# Patient Record
Sex: Female | Born: 1965 | Race: White | Hispanic: No | Marital: Married | Smoking: Never smoker
Health system: Southern US, Community
[De-identification: ages and names within clinical notes are randomized; demographics above are authoritative.]

## PROBLEM LIST (undated history)

## (undated) DIAGNOSIS — R87610 Atypical squamous cells of undetermined significance on cytologic smear of cervix (ASC-US): Secondary | ICD-10-CM

## (undated) DIAGNOSIS — M4802 Spinal stenosis, cervical region: Secondary | ICD-10-CM

## (undated) DIAGNOSIS — R3915 Urgency of urination: Secondary | ICD-10-CM

## (undated) DIAGNOSIS — G43909 Migraine, unspecified, not intractable, without status migrainosus: Secondary | ICD-10-CM

## (undated) DIAGNOSIS — Z923 Personal history of irradiation: Secondary | ICD-10-CM

## (undated) DIAGNOSIS — E039 Hypothyroidism, unspecified: Secondary | ICD-10-CM

## (undated) DIAGNOSIS — C50919 Malignant neoplasm of unspecified site of unspecified female breast: Secondary | ICD-10-CM

## (undated) HISTORY — DX: Atypical squamous cells of undetermined significance on cytologic smear of cervix (ASC-US): R87.610

## (undated) HISTORY — DX: Malignant neoplasm of unspecified site of unspecified female breast: C50.919

## (undated) HISTORY — PX: AUGMENTATION MAMMAPLASTY: SUR837

---

## 1994-11-03 HISTORY — PX: DIAGNOSTIC LAPAROSCOPY: SUR761

## 1999-02-01 ENCOUNTER — Other Ambulatory Visit: Admission: RE | Admit: 1999-02-01 | Discharge: 1999-02-01 | Payer: Self-pay | Admitting: Gynecology

## 2000-03-24 ENCOUNTER — Other Ambulatory Visit: Admission: RE | Admit: 2000-03-24 | Discharge: 2000-03-24 | Payer: Self-pay | Admitting: Gynecology

## 2001-04-12 ENCOUNTER — Other Ambulatory Visit: Admission: RE | Admit: 2001-04-12 | Discharge: 2001-04-12 | Payer: Self-pay | Admitting: Family Medicine

## 2001-04-25 ENCOUNTER — Encounter: Payer: Self-pay | Admitting: Family Medicine

## 2001-04-25 ENCOUNTER — Ambulatory Visit (HOSPITAL_COMMUNITY): Admission: RE | Admit: 2001-04-25 | Discharge: 2001-04-25 | Payer: Self-pay | Admitting: Family Medicine

## 2001-07-16 ENCOUNTER — Other Ambulatory Visit: Admission: RE | Admit: 2001-07-16 | Discharge: 2001-07-16 | Payer: Self-pay | Admitting: Family Medicine

## 2002-03-09 ENCOUNTER — Other Ambulatory Visit: Admission: RE | Admit: 2002-03-09 | Discharge: 2002-03-09 | Payer: Self-pay | Admitting: Gynecology

## 2003-05-24 ENCOUNTER — Ambulatory Visit (HOSPITAL_COMMUNITY): Admission: RE | Admit: 2003-05-24 | Discharge: 2003-05-24 | Payer: Self-pay | Admitting: Family Medicine

## 2003-05-24 ENCOUNTER — Encounter: Payer: Self-pay | Admitting: Family Medicine

## 2003-07-19 ENCOUNTER — Other Ambulatory Visit: Admission: RE | Admit: 2003-07-19 | Discharge: 2003-07-19 | Payer: Self-pay | Admitting: Family Medicine

## 2004-01-17 ENCOUNTER — Other Ambulatory Visit: Admission: RE | Admit: 2004-01-17 | Discharge: 2004-01-17 | Payer: Self-pay | Admitting: Gynecology

## 2005-04-22 ENCOUNTER — Other Ambulatory Visit: Admission: RE | Admit: 2005-04-22 | Discharge: 2005-04-22 | Payer: Self-pay | Admitting: Family Medicine

## 2006-05-20 ENCOUNTER — Other Ambulatory Visit: Admission: RE | Admit: 2006-05-20 | Discharge: 2006-05-20 | Payer: Self-pay | Admitting: Family Medicine

## 2007-05-26 ENCOUNTER — Other Ambulatory Visit: Admission: RE | Admit: 2007-05-26 | Discharge: 2007-05-26 | Payer: Self-pay | Admitting: Family Medicine

## 2007-06-01 ENCOUNTER — Ambulatory Visit (HOSPITAL_COMMUNITY): Admission: RE | Admit: 2007-06-01 | Discharge: 2007-06-01 | Payer: Self-pay | Admitting: Family Medicine

## 2007-12-16 ENCOUNTER — Encounter: Admission: RE | Admit: 2007-12-16 | Discharge: 2007-12-16 | Payer: Self-pay | Admitting: Family Medicine

## 2008-05-26 ENCOUNTER — Other Ambulatory Visit: Admission: RE | Admit: 2008-05-26 | Discharge: 2008-05-26 | Payer: Self-pay | Admitting: Family Medicine

## 2008-11-03 DIAGNOSIS — C50919 Malignant neoplasm of unspecified site of unspecified female breast: Secondary | ICD-10-CM

## 2008-11-03 HISTORY — PX: BREAST SURGERY: SHX581

## 2008-11-03 HISTORY — DX: Malignant neoplasm of unspecified site of unspecified female breast: C50.919

## 2009-06-18 ENCOUNTER — Other Ambulatory Visit: Admission: RE | Admit: 2009-06-18 | Discharge: 2009-06-18 | Payer: Self-pay | Admitting: Family Medicine

## 2009-07-18 ENCOUNTER — Ambulatory Visit (HOSPITAL_COMMUNITY): Admission: RE | Admit: 2009-07-18 | Discharge: 2009-07-18 | Payer: Self-pay | Admitting: Family Medicine

## 2009-07-27 ENCOUNTER — Encounter: Admission: RE | Admit: 2009-07-27 | Discharge: 2009-07-27 | Payer: Self-pay | Admitting: Family Medicine

## 2009-09-05 ENCOUNTER — Encounter (INDEPENDENT_AMBULATORY_CARE_PROVIDER_SITE_OTHER): Payer: Self-pay | Admitting: Family Medicine

## 2009-09-05 ENCOUNTER — Encounter: Admission: RE | Admit: 2009-09-05 | Discharge: 2009-09-05 | Payer: Self-pay | Admitting: Family Medicine

## 2009-09-05 ENCOUNTER — Encounter (INDEPENDENT_AMBULATORY_CARE_PROVIDER_SITE_OTHER): Payer: Self-pay | Admitting: Diagnostic Radiology

## 2009-09-11 ENCOUNTER — Encounter: Admission: RE | Admit: 2009-09-11 | Discharge: 2009-09-11 | Payer: Self-pay | Admitting: Family Medicine

## 2009-09-24 ENCOUNTER — Encounter: Admission: RE | Admit: 2009-09-24 | Discharge: 2009-09-24 | Payer: Self-pay | Admitting: General Surgery

## 2009-09-25 ENCOUNTER — Encounter: Admission: RE | Admit: 2009-09-25 | Discharge: 2009-09-25 | Payer: Self-pay | Admitting: General Surgery

## 2009-09-25 ENCOUNTER — Ambulatory Visit (HOSPITAL_BASED_OUTPATIENT_CLINIC_OR_DEPARTMENT_OTHER): Admission: RE | Admit: 2009-09-25 | Discharge: 2009-09-25 | Payer: Self-pay | Admitting: General Surgery

## 2009-09-25 HISTORY — PX: BREAST LUMPECTOMY: SHX2

## 2009-10-03 ENCOUNTER — Ambulatory Visit: Admission: RE | Admit: 2009-10-03 | Discharge: 2009-10-31 | Payer: Self-pay | Admitting: Radiation Oncology

## 2009-10-23 ENCOUNTER — Ambulatory Visit: Payer: Self-pay | Admitting: Oncology

## 2009-10-23 LAB — COMPREHENSIVE METABOLIC PANEL
ALT: 15 U/L (ref 0–35)
AST: 18 U/L (ref 0–37)
Alkaline Phosphatase: 31 U/L — ABNORMAL LOW (ref 39–117)
BUN: 12 mg/dL (ref 6–23)
Calcium: 8.5 mg/dL (ref 8.4–10.5)
Chloride: 105 mEq/L (ref 96–112)
Creatinine, Ser: 0.68 mg/dL (ref 0.40–1.20)

## 2009-10-23 LAB — CBC WITH DIFFERENTIAL/PLATELET
BASO%: 0.3 % (ref 0.0–2.0)
EOS%: 2.6 % (ref 0.0–7.0)
HCT: 36.6 % (ref 34.8–46.6)
MCH: 33.8 pg (ref 25.1–34.0)
MCHC: 34.2 g/dL (ref 31.5–36.0)
MCV: 98.8 fL (ref 79.5–101.0)
MONO%: 6.2 % (ref 0.0–14.0)
NEUT%: 72.3 % (ref 38.4–76.8)
RDW: 12.1 % (ref 11.2–14.5)
lymph#: 1.2 10*3/uL (ref 0.9–3.3)

## 2009-10-29 ENCOUNTER — Encounter: Admission: RE | Admit: 2009-10-29 | Discharge: 2009-10-29 | Payer: Self-pay | Admitting: Radiation Oncology

## 2009-11-05 ENCOUNTER — Ambulatory Visit: Admission: RE | Admit: 2009-11-05 | Discharge: 2010-01-04 | Payer: Self-pay | Admitting: Radiation Oncology

## 2009-12-27 ENCOUNTER — Encounter: Admission: RE | Admit: 2009-12-27 | Discharge: 2009-12-27 | Payer: Self-pay | Admitting: Family Medicine

## 2010-05-31 ENCOUNTER — Encounter: Admission: RE | Admit: 2010-05-31 | Discharge: 2010-05-31 | Payer: Self-pay | Admitting: Family Medicine

## 2010-11-24 ENCOUNTER — Encounter: Payer: Self-pay | Admitting: Family Medicine

## 2011-02-05 LAB — DIFFERENTIAL
Basophils Absolute: 0 10*3/uL (ref 0.0–0.1)
Eosinophils Absolute: 0.1 10*3/uL (ref 0.0–0.7)
Eosinophils Relative: 2 % (ref 0–5)
Monocytes Absolute: 0.4 10*3/uL (ref 0.1–1.0)

## 2011-02-05 LAB — CBC
MCHC: 34.9 g/dL (ref 30.0–36.0)
MCV: 98.7 fL (ref 78.0–100.0)
Platelets: 189 10*3/uL (ref 150–400)

## 2011-02-05 LAB — BASIC METABOLIC PANEL
BUN: 12 mg/dL (ref 6–23)
CO2: 28 mEq/L (ref 19–32)
Calcium: 8.8 mg/dL (ref 8.4–10.5)
Creatinine, Ser: 0.85 mg/dL (ref 0.4–1.2)
Glucose, Bld: 69 mg/dL — ABNORMAL LOW (ref 70–99)

## 2011-02-05 LAB — CEA: CEA: 0.8 ng/mL (ref 0.0–5.0)

## 2011-02-05 LAB — LACTATE DEHYDROGENASE: LDH: 122 U/L (ref 94–250)

## 2011-03-14 ENCOUNTER — Other Ambulatory Visit: Payer: Self-pay | Admitting: Dermatology

## 2011-05-20 ENCOUNTER — Other Ambulatory Visit (HOSPITAL_COMMUNITY): Payer: Self-pay | Admitting: Family Medicine

## 2011-05-20 DIAGNOSIS — Z1231 Encounter for screening mammogram for malignant neoplasm of breast: Secondary | ICD-10-CM

## 2011-07-02 ENCOUNTER — Ambulatory Visit (HOSPITAL_COMMUNITY)
Admission: RE | Admit: 2011-07-02 | Discharge: 2011-07-02 | Disposition: A | Discharge status: Home / Self Care | Payer: BC Managed Care – PPO | Source: Ambulatory Visit | Attending: Family Medicine | Admitting: Family Medicine

## 2011-07-02 DIAGNOSIS — Z1231 Encounter for screening mammogram for malignant neoplasm of breast: Secondary | ICD-10-CM

## 2011-07-08 ENCOUNTER — Other Ambulatory Visit: Payer: Self-pay | Admitting: Family Medicine

## 2011-07-08 DIAGNOSIS — Z9889 Other specified postprocedural states: Secondary | ICD-10-CM

## 2011-07-11 ENCOUNTER — Ambulatory Visit
Admission: RE | Admit: 2011-07-11 | Discharge: 2011-07-11 | Disposition: A | Discharge status: Home / Self Care | Payer: BC Managed Care – PPO | Source: Ambulatory Visit | Attending: Family Medicine | Admitting: Family Medicine

## 2011-07-11 DIAGNOSIS — Z9889 Other specified postprocedural states: Secondary | ICD-10-CM

## 2012-01-05 ENCOUNTER — Other Ambulatory Visit: Payer: Self-pay | Admitting: Family Medicine

## 2012-02-20 ENCOUNTER — Telehealth: Payer: Self-pay | Admitting: *Deleted

## 2012-02-20 NOTE — Telephone Encounter (Signed)
Patient was driving and not able to speak with me.  Requested for me to call her on Monday.

## 2012-02-23 ENCOUNTER — Telehealth: Payer: Self-pay | Admitting: *Deleted

## 2012-02-23 NOTE — Telephone Encounter (Signed)
Confirmed 03/04/12 appt w/ pt.

## 2012-03-03 ENCOUNTER — Telehealth: Payer: Self-pay | Admitting: *Deleted

## 2012-03-03 NOTE — Telephone Encounter (Signed)
Called patient to reschedule 5/2 genetics appt due to Clydie Braun unable to be here.  Confirmed 03/18/12 genetics appt w/ pt.

## 2012-03-04 ENCOUNTER — Encounter: Payer: BC Managed Care – PPO | Admitting: Genetic Counselor

## 2012-03-04 ENCOUNTER — Other Ambulatory Visit: Payer: BC Managed Care – PPO | Admitting: Lab

## 2012-03-08 ENCOUNTER — Other Ambulatory Visit: Payer: Self-pay | Admitting: Family Medicine

## 2012-03-08 DIAGNOSIS — N63 Unspecified lump in unspecified breast: Secondary | ICD-10-CM

## 2012-03-11 ENCOUNTER — Ambulatory Visit
Admission: RE | Admit: 2012-03-11 | Discharge: 2012-03-11 | Disposition: A | Discharge status: Home / Self Care | Payer: BC Managed Care – PPO | Source: Ambulatory Visit | Attending: Family Medicine | Admitting: Family Medicine

## 2012-03-11 DIAGNOSIS — N63 Unspecified lump in unspecified breast: Secondary | ICD-10-CM

## 2012-03-18 ENCOUNTER — Other Ambulatory Visit: Payer: BC Managed Care – PPO | Admitting: Lab

## 2012-03-18 ENCOUNTER — Ambulatory Visit (HOSPITAL_BASED_OUTPATIENT_CLINIC_OR_DEPARTMENT_OTHER): Payer: BC Managed Care – PPO | Admitting: Genetic Counselor

## 2012-03-18 DIAGNOSIS — C50919 Malignant neoplasm of unspecified site of unspecified female breast: Secondary | ICD-10-CM

## 2012-03-18 NOTE — Progress Notes (Signed)
Dr. Dwain Sarna requested a consultation for genetic counseling and risk assessment for [name], a 46 y.o. female, for discussion of her personal history of breast cancer and family history of a known BRCA mutation. She presents to clinic today to discuss the possibility of a genetic predisposition to cancer, and to further clarify her risks, as well as her family members' risks for cancer.   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 This was treated with surgery and radiation.   No past medical history on file.  No past surgical history on file.  History  Substance Use Topics  . Smoking status: Not on file  . Smokeless tobacco: Not on file  . Alcohol Use: Not on file    REPRODUCTIVE HISTORY AND PERSONAL RISK ASSESSMENT FACTORS: Menarche was at age 54.   Premenopausal Uterus Intact: Yes Ovaries Intact: Yes G2P2A0 , first live birth at age 73  She has previously undergone treatment for infertility.   OCP use for 2 years She has not used HRT in the past.    FAMILY HISTORY:  We obtained a detailed, 4-generation family history.  Significant diagnoses are listed below: The patient was diagnosed with breast cancer at age 46.  Her maternal uncle was diagnosed with appendiceal cancer around age 58.  He has two daughters - one was diagnosed with breast cancer at ages 34 and 52, the other was diagnosed under the age of 32.  Both of these individuals reportedly tested positive for a BRCA mutation.  The patient's maternal grandmother was diagnosed with endometrial cancer at age 30 and colon cancer at age 65.  There is no other reported cancer on this side of the family.  The patient has a paternal cousin who was diagnosed with non-hodgkins lymphoma at age 4, and breast cancer in her 40s.  There is no other reported cancer on this side of the family.  Patient's maternal ancestors are of English descent,  and paternal ancestors are of unknown descent. There is no reported Ashkenazi Jewish ancestry. There is no  known consanguinity.  GENETIC COUNSELING RISK ASSESSMENT, DISCUSSION, AND SUGGESTED FOLLOW UP: We reviewed the natural history and genetic etiology of sporadic, familial and hereditary cancer syndromes.  About 5-10% of breast cancer is hereditary and of this, about 85% is the result of BRCA1 and 2 mutations. We reviewed the red flags of hereditary breast cancer and dominant inheritance patterns. The patient's cousins tested positive for a BRCA mutation. She was unsure whether the cousin's mother had cancer.  Therefore, the patient's uncle has a 50% chance of being a carrier of the BRCA mutation, the patients' mother has a 25% chance of having a mutation - even though she has never had cancer - and the patient has a 12% chance of testing positive.  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 If a mutation indicating HBOC is detected in this case, the [employer] recommendations would include increased cancer surveillance and possible prophylactic sugery. If a mutation is detected, the patient will be referred back to the referring provider and to any additional appropriate care providers to discuss the relevant options.   If a mutation is not found in the patient, cancer surveillance  options would be discussed for the patient according to the appropriate standard National Comprehensive Cancer Network and American Cancer Society guidelines, with consideration of their personal and family history risk factors. In this case, the patient will be referred back to their care providers for discussions of management.   After considering the risks,  benefits, and limitations, the patient provided informed consent for  the following testing:  Single site testing through [employer].   Per the patient's request, we will contact her by telephone to discuss these results. A follow up genetic counseling visit will be scheduled if indicated.  The patient was seen for a total of 60 minutes, greater than 50% of which was spent face-to-face counseling.  This plan is being carried out per Dr. Doreen Salvage recommendations.  This note will also be sent to the referring provider via the electronic medical record. The patient will be supplied with a summary of this genetic counseling discussion as well as educational information on the discussed hereditary cancer syndromes following the conclusion of their visit.   Patient was discussed with Dr. Drue Second.  _______________________________________________________________________ For Office Staff:  Number of people involved in session: 3 Was an Intern/ student involved with case: yes

## 2012-03-30 ENCOUNTER — Encounter: Payer: Self-pay | Admitting: Genetic Counselor

## 2012-04-05 ENCOUNTER — Telehealth: Payer: Self-pay | Admitting: Genetic Counselor

## 2012-04-05 NOTE — Telephone Encounter (Signed)
Revealed negative results on the mutation found in her maternal cousins.  Because of the FH of early onset breast cancer in a paternal cousin, we will ask [facility] to reflex to BRCA and BART sequencing. This will take an additional 2 weeks.

## 2012-04-13 ENCOUNTER — Encounter: Payer: Self-pay | Admitting: Genetic Counselor

## 2012-04-15 ENCOUNTER — Encounter: Payer: Self-pay | Admitting: Genetic Counselor

## 2012-06-10 ENCOUNTER — Other Ambulatory Visit: Payer: Self-pay | Admitting: Family Medicine

## 2012-06-10 DIAGNOSIS — N6009 Solitary cyst of unspecified breast: Secondary | ICD-10-CM

## 2012-07-02 ENCOUNTER — Ambulatory Visit
Admission: RE | Admit: 2012-07-02 | Discharge: 2012-07-02 | Disposition: A | Discharge status: Home / Self Care | Payer: BC Managed Care – PPO | Source: Ambulatory Visit | Attending: Family Medicine | Admitting: Family Medicine

## 2012-07-02 ENCOUNTER — Other Ambulatory Visit: Payer: Self-pay | Admitting: Family Medicine

## 2012-07-02 DIAGNOSIS — N6009 Solitary cyst of unspecified breast: Secondary | ICD-10-CM

## 2012-08-05 ENCOUNTER — Telehealth: Payer: Self-pay | Admitting: Genetic Counselor

## 2012-08-05 NOTE — Telephone Encounter (Signed)
Patient called stating that [employer] has denied the claim from [facility] about her genetic testing.  I called [facility] and asked that they please call patient and discuss her bill and what she needs to do in order to help rectify it.  They will call her today and discuss with her what needs to be done to help get [employer] to pay the claim.  I called the patient back to let her know that they will call her and she had them on the other line and was already talking with them.

## 2012-09-01 ENCOUNTER — Other Ambulatory Visit: Payer: Self-pay | Admitting: Family Medicine

## 2012-09-01 ENCOUNTER — Other Ambulatory Visit (HOSPITAL_COMMUNITY)
Admission: RE | Admit: 2012-09-01 | Discharge: 2012-09-01 | Disposition: A | Discharge status: Home / Self Care | Payer: BC Managed Care – PPO | Source: Ambulatory Visit | Attending: Family Medicine | Admitting: Family Medicine

## 2012-09-01 DIAGNOSIS — Z0000 Encounter for general adult medical examination without abnormal findings: Secondary | ICD-10-CM | POA: Insufficient documentation

## 2013-04-07 ENCOUNTER — Other Ambulatory Visit: Payer: Self-pay | Admitting: Plastic Surgery

## 2013-04-07 DIAGNOSIS — Z9889 Other specified postprocedural states: Secondary | ICD-10-CM

## 2013-04-07 DIAGNOSIS — Z853 Personal history of malignant neoplasm of breast: Secondary | ICD-10-CM

## 2013-06-06 ENCOUNTER — Ambulatory Visit
Admission: RE | Admit: 2013-06-06 | Discharge: 2013-06-06 | Disposition: A | Discharge status: Home / Self Care | Payer: BC Managed Care – PPO | Source: Ambulatory Visit | Attending: Plastic Surgery | Admitting: Plastic Surgery

## 2013-06-06 DIAGNOSIS — Z9889 Other specified postprocedural states: Secondary | ICD-10-CM

## 2013-06-06 DIAGNOSIS — Z853 Personal history of malignant neoplasm of breast: Secondary | ICD-10-CM

## 2013-06-09 ENCOUNTER — Other Ambulatory Visit: Payer: Self-pay | Admitting: Plastic Surgery

## 2013-10-20 ENCOUNTER — Telehealth (INDEPENDENT_AMBULATORY_CARE_PROVIDER_SITE_OTHER): Payer: Self-pay

## 2013-10-20 NOTE — Telephone Encounter (Signed)
Returned pt's call. The pt wants a referral to see Dr Welton Flakes now at the [facility] instead of seeing Dr Darnelle Catalan again. The pt was seen back in 2010 but declined the anti estrogen therapy at that time. The pt has changed her mind now and she wants to talk more the medication. I advised pt that I sent Marianne Sofia a message at the [facility] for her to get in touch with the pt to schedule her an appt with Dr Welton Flakes. I advised pt that if she does not hear back from [facility] in the next week to call me. The pt understands.

## 2013-10-20 NOTE — Telephone Encounter (Signed)
Message copied by Ethlyn Gallery on Thu Oct 20, 2013  3:27 PM ------      Message from: Louie Casa      Created: Wed Oct 19, 2013 11:07 AM      Regarding: Dr. Dwain Sarna Referral       Contact: [phone]       The patient saw Doctor Dwain Sarna in 2010 and wants if can refer or recommend an oncologist, please call her.            Thank you. ------

## 2013-10-21 ENCOUNTER — Telehealth: Payer: Self-pay | Admitting: *Deleted

## 2013-10-21 NOTE — Telephone Encounter (Signed)
Spoke to pt informing her that Dr. Welton Flakes is out of the office until 11/08/13.  Relayed to pt that we will schedule her with Dr. Welton Flakes as soon as Dr. Welton Flakes gives [place] her first available date that she can work her in.  Pt very understandable.  She relayed that she has waited a long time and that it was best she followed what the doctors recommended.  Pt denies further needs.

## 2013-10-24 ENCOUNTER — Encounter: Payer: Self-pay | Admitting: *Deleted

## 2013-10-24 ENCOUNTER — Telehealth (INDEPENDENT_AMBULATORY_CARE_PROVIDER_SITE_OTHER): Payer: Self-pay

## 2013-10-24 DIAGNOSIS — Z853 Personal history of malignant neoplasm of breast: Secondary | ICD-10-CM

## 2013-10-24 NOTE — Progress Notes (Signed)
Received referral in the workque and [employer] about the date and time that I have available to schedule for Dr. Welton Flakes.  I am awaiting her response before I call the pt.

## 2013-10-24 NOTE — Telephone Encounter (Signed)
Message copied by Ethlyn Gallery on Mon Oct 24, 2013 10:00 AM ------      Message from: Pershing Proud      Created: Fri Oct 21, 2013  4:13 PM       Will you please put a referral in for her to see Dr. Welton Flakes.  We are going to schedule her on 11/17/13 at 1:00.            Thanks,      Dawn            ----- Message -----         From: Ethlyn Gallery, MA         Sent: 10/20/2013   3:20 PM           To: Lysbeth Penner, RN            Hello,      It's me again bugging you for Dr Dwain Sarna. This pt called me wanting a referral back to [facility]. 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 The pt has now changed her mind and she wants another appt to be seen again but with Dr Welton Flakes now. I know the pt called her self today to see about making an appt with Dr Welton Flakes but she has not heard back yet. I explained all this to Dr Dwain Sarna and he told me to send you a message to help get this pt into see Dr Welton Flakes now to discuss anti estrogen therapy.       Just let me know if I need to do anything to help.      Thanks,      Elease Hashimoto       ------

## 2013-10-24 NOTE — Telephone Encounter (Signed)
LMOM stating that the referral has been placed to the [facility] for the appt with Dr Welton Flakes.

## 2013-10-25 ENCOUNTER — Telehealth: Payer: Self-pay | Admitting: *Deleted

## 2013-10-25 NOTE — Telephone Encounter (Signed)
Got the go from [place] at [facility] on how and when to schedule the pt.  Called and left a message for the pt to return my call so I can schedule her w/ a med onc.

## 2013-10-25 NOTE — Telephone Encounter (Signed)
Pt returned my call and I confirmed 12/02/13 appt w/ pt.  Unable to schedule the lab appt - emailed Tiffany to schedule that for me.  At the time of scheduling this appt, there are only 11 pts on Dr. Milta Deiters schedule.  Mailed before appt letter, welcome packet & intake form to pt.  Emailed Alisha at [facility] to make her aware.  Took paperwork to Med Rec for chart.

## 2013-11-15 ENCOUNTER — Other Ambulatory Visit: Payer: Self-pay | Admitting: *Deleted

## 2013-11-15 DIAGNOSIS — C50512 Malignant neoplasm of lower-outer quadrant of left female breast: Secondary | ICD-10-CM | POA: Insufficient documentation

## 2013-11-18 ENCOUNTER — Encounter: Payer: Self-pay | Admitting: *Deleted

## 2013-11-18 NOTE — Progress Notes (Signed)
Completed chart.  Varney Biles already entered labs.  Added to Spreadsheet and placed in Dr. Laurelyn Sickle box.

## 2013-12-02 ENCOUNTER — Ambulatory Visit: Payer: Self-pay

## 2013-12-02 ENCOUNTER — Other Ambulatory Visit (HOSPITAL_BASED_OUTPATIENT_CLINIC_OR_DEPARTMENT_OTHER): Payer: BC Managed Care – PPO

## 2013-12-02 ENCOUNTER — Encounter: Payer: Self-pay | Admitting: Oncology

## 2013-12-02 ENCOUNTER — Telehealth: Payer: Self-pay | Admitting: *Deleted

## 2013-12-02 ENCOUNTER — Ambulatory Visit (HOSPITAL_BASED_OUTPATIENT_CLINIC_OR_DEPARTMENT_OTHER): Payer: BC Managed Care – PPO | Admitting: Oncology

## 2013-12-02 VITALS — BP 109/75 | HR 74 | Temp 98.1°F | Resp 18 | Ht 64.0 in | Wt 121.4 lb

## 2013-12-02 DIAGNOSIS — Z170 Estrogen receptor positive status [ER+]: Secondary | ICD-10-CM

## 2013-12-02 DIAGNOSIS — C50512 Malignant neoplasm of lower-outer quadrant of left female breast: Secondary | ICD-10-CM

## 2013-12-02 DIAGNOSIS — D0590 Unspecified type of carcinoma in situ of unspecified breast: Secondary | ICD-10-CM

## 2013-12-02 DIAGNOSIS — C50212 Malignant neoplasm of upper-inner quadrant of left female breast: Secondary | ICD-10-CM | POA: Insufficient documentation

## 2013-12-02 DIAGNOSIS — D0510 Intraductal carcinoma in situ of unspecified breast: Secondary | ICD-10-CM

## 2013-12-02 LAB — COMPREHENSIVE METABOLIC PANEL (CC13)
ALK PHOS: 61 U/L (ref 40–150)
ALT: 18 U/L (ref 0–55)
AST: 19 U/L (ref 5–34)
Albumin: 3.9 g/dL (ref 3.5–5.0)
Anion Gap: 7 mEq/L (ref 3–11)
BILIRUBIN TOTAL: 0.22 mg/dL (ref 0.20–1.20)
BUN: 14.8 mg/dL (ref 7.0–26.0)
CO2: 28 mEq/L (ref 22–29)
Calcium: 9.3 mg/dL (ref 8.4–10.4)
Chloride: 105 mEq/L (ref 98–109)
Creatinine: 0.8 mg/dL (ref 0.6–1.1)
Glucose: 108 mg/dl (ref 70–140)
Potassium: 4.9 mEq/L (ref 3.5–5.1)
SODIUM: 141 meq/L (ref 136–145)
TOTAL PROTEIN: 6.8 g/dL (ref 6.4–8.3)

## 2013-12-02 LAB — CBC WITH DIFFERENTIAL/PLATELET
BASO%: 0.6 % (ref 0.0–2.0)
Basophils Absolute: 0 10*3/uL (ref 0.0–0.1)
EOS%: 4.6 % (ref 0.0–7.0)
Eosinophils Absolute: 0.2 10*3/uL (ref 0.0–0.5)
HCT: 35.9 % (ref 34.8–46.6)
HGB: 12.3 g/dL (ref 11.6–15.9)
LYMPH%: 28.2 % (ref 14.0–49.7)
MCH: 33 pg (ref 25.1–34.0)
MCHC: 34.3 g/dL (ref 31.5–36.0)
MCV: 96.2 fL (ref 79.5–101.0)
MONO#: 0.5 10*3/uL (ref 0.1–0.9)
MONO%: 10.1 % (ref 0.0–14.0)
NEUT#: 2.9 10*3/uL (ref 1.5–6.5)
NEUT%: 56.5 % (ref 38.4–76.8)
PLATELETS: 178 10*3/uL (ref 145–400)
RBC: 3.73 10*6/uL (ref 3.70–5.45)
RDW: 12.3 % (ref 11.2–14.5)
WBC: 5.1 10*3/uL (ref 3.9–10.3)
lymph#: 1.4 10*3/uL (ref 0.9–3.3)

## 2013-12-02 MED ORDER — TAMOXIFEN CITRATE 20 MG PO TABS
20.0000 mg | ORAL_TABLET | Freq: Every day | ORAL | Status: AC
Start: 2013-12-02 — End: 2014-01-01

## 2013-12-02 NOTE — Patient Instructions (Signed)

## 2013-12-02 NOTE — Progress Notes (Signed)
Checked in new patient. She has her [facility] packet. She said she washed her insurance card and will bring back to get in system. She has [facility] now. She knows today is self pay until we get card. No more [employer]. She has appt card.

## 2013-12-02 NOTE — Telephone Encounter (Signed)
appts made and printed...td 

## 2013-12-04 ENCOUNTER — Encounter: Payer: Self-pay | Admitting: Oncology

## 2013-12-04 NOTE — Progress Notes (Signed)
[name] [id number] 12/11/1965 48 y.o. 12/04/2013 4:46 PM  CC  Vidal Schwalbe, MD [place], [place] [address]  REASON FOR CONSULTATION:  48 year old female with prior history of DCIS 2010. Patient is seen in medical oncology for discussion of future breast cancer prevention.  STAGE:   DCIS (ductal carcinoma in situ)   Primary site: Breast   Staging method: AJCC 7th Edition   Clinical free text: Tis   Summary: Incomplete stage  REFERRING PHYSICIAN: Dr. Caren Griffins Romine  HISTORY OF PRESENT ILLNESS:  [name] is a 48 y.o. female.  At the age of 40 presented for a yearly screening mammogram in September 2010. This showed microcalcifications in the left breast. She had a biopsy performed on November 3 that showed low-grade ductal carcinoma in situ associated with fibrocystic change and microcalcifications. The tumor was ER +95% PR +99%. Patient was referred to Dr. Donne Hazel and she underwent needle localization with mammographic guidance encompassing the area of calcifications and had left lumpectomy done. The final pathology showed multifocal ductal carcinoma in situ measuring 1.2 cm with evidence of necrosis and was intermediate grade. There was noted to be atypical ductal hyperplasia but no invasive carcinoma. Patient had radiation therapy performed by Dr. Arloa Koh from 11/19/2006 11 through 12/28/2009. She was recommended antiestrogen therapy with tamoxifen to help reduce risk of recurrence as well as future breast cancer risk reduction. However patient declined. She did have genetic testing performed originally at the time of  Original diagnosis she did not have a mutation in BRCA2 gene. Of note she's had paternal cousins her care repeat BRCA2 gene mutation. Her patient in June 2013 had additional testing done on the BRCA1 and BRCA2 gene to see if there were any mutations she additionally had BRCA analysis rearrangement test (BART). These were also  negative. Patient is now seen in medical oncology for discussion of her future breast cancer risk. She is now open to starting tamoxifen as a chemoprevention. She has apparently done a lot of reading   Past Medical History: Past Medical History  Diagnosis Date  . DCIS (ductal carcinoma in situ) 12/02/2013    Past Surgical History: No past surgical history on file.  Family History: No family history on file.  Social History History  Substance Use Topics  . Smoking status: Not on file  . Smokeless tobacco: Not on file  . Alcohol Use: Not on file    Allergies: No Known Allergies  Current Medications: Current Outpatient Prescriptions  Medication Sig Dispense Refill  . tamoxifen (NOLVADEX) 20 MG tablet Take 1 tablet (20 mg total) by mouth daily.  90 tablet  12   No current facility-administered medications for this visit.    OB/GYN History:menarche at age 15, her last period was in November 2013, patient has had to live births first live birth at 85.  Fertility Discussion: not applicable Prior History of Cancer: as noted above  Health Maintenance:  Colonoscopy 2009 Bone Density no Last PAP smear 2013  ECOG PERFORMANCE STATUS: 0 - Asymptomatic  Genetic Counseling/testing:as noted above  REVIEW OF SYSTEMS:  A comprehensive review of systems was negative.  PHYSICAL EXAMINATION: Blood pressure 109/75, pulse 74, temperature 98.1 F (36.7 C), temperature source Oral, resp. rate 18, height _0  (1.626 m), weight 121 lb 6.4 oz (55.067 kg).  General:  well-nourished in no acute distress.  Eyes:  no scleral icterus.  ENT:  There were no oropharyngeal lesions.  Neck was without thyromegaly.  Lymphatics:  Negative cervical, supraclavicular or axillary adenopathy.  Respiratory: lungs were clear bilaterally without wheezing or crackles.  Cardiovascular:  Regular rate and rhythm, S1/S2, without murmur, rub or gallop.  There was no pedal edema.  GI:  abdomen was soft, flat,  nontender, nondistended, without organomegaly.  Muscoloskeletal:  no spinal tenderness of palpation of vertebral spine.  Skin exam was without echymosis, petichae.  Neuro exam was nonfocal.  Patient was able to get on and off exam table without assistance.  Gait was normal.  Patient was alerted and oriented.  Attention was good.   Language was appropriate.  Mood was normal without depression.  Speech was not pressured.  Thought content was not tangential.   Breasts: breasts appear normal, no suspicious masses, no skin or nipple changes or axillary nodes.surgical scars are noted in the left breast from prior lumpectomy   STUDIES/RESULTS: No results found.   LABS:    Chemistry      Component Value Date/Time   NA 141 12/02/2013 1247   NA 137 10/23/2009 1515   K 4.9 12/02/2013 1247   K 4.1 10/23/2009 1515   CL 105 10/23/2009 1515   CO2 28 12/02/2013 1247   CO2 26 10/23/2009 1515   BUN 14.8 12/02/2013 1247   BUN 12 10/23/2009 1515   CREATININE 0.8 12/02/2013 1247   CREATININE 0.68 10/23/2009 1515      Component Value Date/Time   CALCIUM 9.3 12/02/2013 1247   CALCIUM 8.5 10/23/2009 1515   ALKPHOS 61 12/02/2013 1247   ALKPHOS 31* 10/23/2009 1515   AST 19 12/02/2013 1247   AST 18 10/23/2009 1515   ALT 18 12/02/2013 1247   ALT 15 10/23/2009 1515   BILITOT 0.22 12/02/2013 1247   BILITOT 0.6 10/23/2009 1515      Lab Results  Component Value Date   WBC 5.1 12/02/2013   HGB 12.3 12/02/2013   HCT 35.9 12/02/2013   MCV 96.2 12/02/2013   PLT 178 12/02/2013    ASSESSMENT/PLAN    48 year old female with  #1 history of DCIS originally diagnosed in September 2010. Patient is status post left breast lumpectomy on 09/26/1999 and that revealed DCIS low to intermediate grade with necrosis and microcalcifications. Tumor was ER/PR positive. Post lumpectomy she did receive radiation therapy by Dr. Arloa Koh. She declined antiestrogen therapy.  #2 patient now is seen in medical oncology for  possibility of starting antiestrogen therapy as chemoprevention. She has been doing considerable amount of reading and discussion with other patients and friends. She has read quite a bit about tamoxifen as a chemopreventive. She understands the side effects benefits as well as the risks. We went over these again today.  #3 we discussed her risk of developing another breast cancer given her family history. However of note she is genetically negative for BRCA1 and BRCA2 gene mutation. We discussed the implications of this. I think based on her family history and her personal history her risk of developing another breast cancer is greater than 20%. I do think she would qualify for getting annual MRI as a screening tool in the setting of being high risk. We discussed this today. We certainly would need preauthorization from her insurance. We discussed ongoing mammograms. Certainly she could get a 3-D. She will also need to continue self breast examinations as well as clinical examinations. We discussed lifestyle modification including exercise eating healthy maintaining a good BMI.  #4 tamoxifen: We discussed the role of tamoxifen in prevention of future breast cancer  including invasive and noninvasive diseases. We discussed side effects of tamoxifen including but not limited to hot flashes mood swings vaginal bleeding or discharge uterine cancer blood clots pulmonary embolism DVT early cataracts possibly weight gain etc. Patient was given literature on tamoxifen. We also discussed the possibility of use of Evista. However we would then need to evaluate whether or not she is pre-menopausalversus postmenopausal.  #5 prescription for tamoxifen was sent to her pharmacy today.  #6 I will plan on seeing the patient back in 3 months time for followup   Thank you so much for allowing me to participate in the care of [name]. I will continue to follow up the patient with you and assist in her care.  All  questions were answered. The patient knows to call the clinic with any problems, questions or concerns. We can certainly see the patient much sooner if necessary.  I spent 40 minutes counseling the patient face to face. The total time spent in the appointment was 60 minutes.  Marcy Panning, MD Medical/Oncology [facility] [phone] (beeper) [phone] (Office)

## 2014-02-03 ENCOUNTER — Telehealth: Payer: Self-pay | Admitting: *Deleted

## 2014-02-03 NOTE — Telephone Encounter (Signed)
Mailed after appt letter to pt. 

## 2014-03-01 ENCOUNTER — Telehealth: Payer: Self-pay | Admitting: Oncology

## 2014-03-01 NOTE — Telephone Encounter (Signed)
kk out pt to see cp2 6/2. s/w pt and she would like to have dr moore's credentials before accepting an appt with her.  pt aware that 5/6 appt cx'd and i will get back to her re credentials. message to [facility] re credentials.

## 2014-03-08 ENCOUNTER — Other Ambulatory Visit: Payer: Self-pay

## 2014-03-08 ENCOUNTER — Ambulatory Visit: Payer: Self-pay | Admitting: Oncology

## 2014-04-04 ENCOUNTER — Other Ambulatory Visit (HOSPITAL_BASED_OUTPATIENT_CLINIC_OR_DEPARTMENT_OTHER): Payer: 59

## 2014-04-04 ENCOUNTER — Ambulatory Visit (HOSPITAL_BASED_OUTPATIENT_CLINIC_OR_DEPARTMENT_OTHER): Payer: 59 | Admitting: Internal Medicine

## 2014-04-04 VITALS — BP 102/69 | HR 77 | Temp 98.4°F | Resp 18 | Ht 64.0 in | Wt 118.1 lb

## 2014-04-04 DIAGNOSIS — D0510 Intraductal carcinoma in situ of unspecified breast: Secondary | ICD-10-CM

## 2014-04-04 DIAGNOSIS — F329 Major depressive disorder, single episode, unspecified: Secondary | ICD-10-CM

## 2014-04-04 DIAGNOSIS — Z170 Estrogen receptor positive status [ER+]: Secondary | ICD-10-CM

## 2014-04-04 DIAGNOSIS — C50512 Malignant neoplasm of lower-outer quadrant of left female breast: Secondary | ICD-10-CM

## 2014-04-04 DIAGNOSIS — D0590 Unspecified type of carcinoma in situ of unspecified breast: Secondary | ICD-10-CM

## 2014-04-04 DIAGNOSIS — F3289 Other specified depressive episodes: Secondary | ICD-10-CM

## 2014-04-04 LAB — CBC WITH DIFFERENTIAL/PLATELET
BASO%: 1 % (ref 0.0–2.0)
Basophils Absolute: 0 10*3/uL (ref 0.0–0.1)
EOS%: 3.8 % (ref 0.0–7.0)
Eosinophils Absolute: 0.2 10*3/uL (ref 0.0–0.5)
HEMATOCRIT: 37.8 % (ref 34.8–46.6)
HGB: 12.7 g/dL (ref 11.6–15.9)
LYMPH%: 29.2 % (ref 14.0–49.7)
MCH: 32.8 pg (ref 25.1–34.0)
MCHC: 33.6 g/dL (ref 31.5–36.0)
MCV: 97.4 fL (ref 79.5–101.0)
MONO#: 0.4 10*3/uL (ref 0.1–0.9)
MONO%: 9.9 % (ref 0.0–14.0)
NEUT#: 2.3 10*3/uL (ref 1.5–6.5)
NEUT%: 56.1 % (ref 38.4–76.8)
PLATELETS: 162 10*3/uL (ref 145–400)
RBC: 3.88 10*6/uL (ref 3.70–5.45)
RDW: 12.3 % (ref 11.2–14.5)
WBC: 4 10*3/uL (ref 3.9–10.3)
lymph#: 1.2 10*3/uL (ref 0.9–3.3)

## 2014-04-04 LAB — COMPREHENSIVE METABOLIC PANEL (CC13)
ALT: 16 U/L (ref 0–55)
ANION GAP: 10 meq/L (ref 3–11)
AST: 20 U/L (ref 5–34)
Albumin: 3.6 g/dL (ref 3.5–5.0)
Alkaline Phosphatase: 50 U/L (ref 40–150)
BILIRUBIN TOTAL: 0.43 mg/dL (ref 0.20–1.20)
BUN: 18.9 mg/dL (ref 7.0–26.0)
CO2: 24 meq/L (ref 22–29)
CREATININE: 0.8 mg/dL (ref 0.6–1.1)
Calcium: 8.6 mg/dL (ref 8.4–10.4)
Chloride: 107 mEq/L (ref 98–109)
Glucose: 78 mg/dl (ref 70–140)
Potassium: 4.5 mEq/L (ref 3.5–5.1)
Sodium: 141 mEq/L (ref 136–145)
Total Protein: 6.7 g/dL (ref 6.4–8.3)

## 2014-04-04 NOTE — Progress Notes (Signed)
[name] [id number] Jan 01, 1966 48 y.o. 04/04/2014 10:42 AM  CC  Vidal Schwalbe, MD [place], [place] [address]  CHIEF COMPLAINT: DCIS  STAGE:  DCIS (ductal carcinoma in situ)   Primary site: Breast   Staging method: AJCC 7th Edition   Clinical free text: Tis   Summary: Incomplete stage  REFERRING PHYSICIAN: Dr. Caren Griffins Romine  HISTORY OF PRESENT ILLNESS: [name] is a 48 y.o. female here for followup.  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  Breast history:  At the age of 10 presented for a yearly screening mammogram in September 2010. This showed microcalcifications in the left breast. She had a biopsy performed on November 3 that showed low-grade ductal carcinoma in situ associated with fibrocystic change and microcalcifications. The tumor was ER +95% PR +99%. Patient was referred to Dr. Donne Hazel and she underwent needle localization with mammographic guidance encompassing the area of calcifications and had left lumpectomy done. The final pathology showed multifocal ductal carcinoma in situ measuring 1.2 cm with evidence of necrosis and was intermediate grade. There was noted to be atypical ductal hyperplasia but no invasive carcinoma. Patient had radiation therapy performed by Dr. Arloa Koh from 11/19/2006 11 through 12/28/2009. She was recommended antiestrogen therapy with tamoxifen to help reduce risk of recurrence as well as future breast cancer risk reduction. However patient declined. She did have genetic testing performed originally at the time of  Original diagnosis she did not have a mutation in BRCA2 gene. Of note she's had paternal cousins her care repeat BRCA2 gene mutation. Her patient in June 2013 had additional testing  done on the BRCA1 and BRCA2 gene to see if there were any mutations she additionally had BRCA analysis rearrangement test (BART) that were negative.   INTERVAL HISTORY: She denies any significant hot flashes while on tamoxifen. When she was first on tamoxifen, migraines were less in frequency. However her migraines and out back. She was placed on Effexor at 75 mg a day by her PCP for her migraines which did help her. She complaints of feeling sad. This started even back in January during the winter months. She feels that his status has worsened progressively since then. She has been this Saturday for 20 years ago when she had severe hyper thyroidism. She had thyroid ablation, and is now on thyroid supplements. She is maintained that her thyroid levels are normal currently. She has social stressors at home, which she is taking care of a 50 year-old high functioning autistic child. Her daughter has severe OCD, and this can be quite stressful for patient. In addition, her husband works full time, hence she is a predominant caregiver.  Her mammogram in August of 2014 did not show any suspicious findings.   ROS:  Negative for respiratory, negative for skin, positive for neurology with migraine, negative for cardiovascular, negative for gastroenterology, negative for endocrine, negative for ID, negative for genitourinary, positive for mental health as noted in history of present illness with depression, negative for constitutional symptoms, negative for musculoskeletal, negative for HEENT  Past Medical History: Past Medical History  Diagnosis Date  . DCIS (ductal carcinoma in situ) 12/02/2013  . Breast cancer     Past Surgical History: No past surgical history on file.  Family History: No family history on  file.  Social History History  Substance Use Topics  . Smoking status: Never Smoker   . Smokeless tobacco: Never Used  . Alcohol Use: No   Allergies: No Known Allergies  Current  Medications: Current Outpatient Prescriptions  Medication Sig Dispense Refill  . ARMOUR THYROID 30 MG tablet       . cyclobenzaprine (FLEXERIL) 5 MG tablet       . fluticasone (FLONASE) 50 MCG/ACT nasal spray       . montelukast (SINGULAIR) 10 MG tablet       . rizatriptan (MAXALT-MLT) 10 MG disintegrating tablet       . tamoxifen (NOLVADEX) 20 MG tablet       . venlafaxine XR (EFFEXOR-XR) 75 MG 24 hr capsule        No current facility-administered medications for this visit.   OB/GYN History:menarche at age 89, her last period was in November 2013, patient has had to live births first live birth at 73.  Fertility Discussion: not applicable Prior History of Cancer: as noted above  Health Maintenance: Colonoscopy 2009 Bone Density no Last PAP smear 2013  ECOG PERFORMANCE STATUS: 0 - Asymptomatic  Genetic Counseling/testing:as noted above  PHYSICAL EXAMINATION: Blood pressure 102/69, pulse 77, temperature 98.4 F (36.9 C), temperature source Oral, resp. rate 18, height _0  (1.626 m), weight 118 lb 1.6 oz (53.57 kg). General: well-nourished in no acute distress. Alert and oriented X 3 HEENT: PERRLA. Anicteric sclerae.  Mucous membranes moist Lymph nodes: no adenopathy appreciated within cervical, supraclavicular, infraclavicular or axillary regions.  Lungs: Clear to ausculatation throughout. No rales or wheezing. Cardiovascular: regular, rate, rhythm. S1, S2. No jugular vein distension. Positive peripheral pulses. Breast:  no palpable masses in both breasts, as well as bilateral implants. Lumpectomy scar noted well healed on the left. Right lower breast keloid scar noted. Abdomen:  Positive bowel sounds. No hepatospenomegaly. Non-tender. Non-distended Groin: No adenopathy Extremities: No swelling bilaterally Cranial nerves: no gross focal deficits Skin: Keloid on right lower breast status post implant placement for breast enlargement  LABS:    Chemistry      Component  Value Date/Time   NA 141 04/04/2014 0926   NA 137 10/23/2009 1515   K 4.5 04/04/2014 0926   K 4.1 10/23/2009 1515   CL 105 10/23/2009 1515   CO2 24 04/04/2014 0926   CO2 26 10/23/2009 1515   BUN 18.9 04/04/2014 0926   BUN 12 10/23/2009 1515   CREATININE 0.8 04/04/2014 0926   CREATININE 0.68 10/23/2009 1515      Component Value Date/Time   CALCIUM 8.6 04/04/2014 0926   CALCIUM 8.5 10/23/2009 1515   ALKPHOS 50 04/04/2014 0926   ALKPHOS 31* 10/23/2009 1515   AST 20 04/04/2014 0926   AST 18 10/23/2009 1515   ALT 16 04/04/2014 0926   ALT 15 10/23/2009 1515   BILITOT 0.43 04/04/2014 0926   BILITOT 0.6 10/23/2009 1515      Lab Results  Component Value Date   WBC 4.0 04/04/2014   HGB 12.7 04/04/2014   HCT 37.8 04/04/2014   MCV 97.4 04/04/2014   PLT 162 04/04/2014    IMPRESSION/REPORT/PLAN 1) 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 2) BRCA negative by BART  She will continue on tamoxifen. Hot flashes are minimal. She does not have any complaints of  abnormal vaginal spotting. She will followup with the gynecologist on a yearly basis for good pelvic exam. Recommend that she have her annual mammogram which will help schedule for August of this year. Plan followup in one year.  3) Depression  She may have seasonal affective disorder. While it is now spent the summer, it has been cloudy and she may not be exposed to it in half daylight. We discussed having her obtain a full spectrum light bulb and exposing herself to light for at least 12-14 hours a day. She is keen on considering him on  "natural ways" to alleviate her depression.  We also discussed options for increasing her Effexor dose. If the Effexor at the higher dose as I help her depression, consideration for up alternate anti-depression  discussed.  Multiple questions answered  Total time 25 minutes face-to-face with more than 50% on counseling.   Dr. Doristine Church

## 2014-04-07 ENCOUNTER — Telehealth: Payer: Self-pay | Admitting: Hematology and Oncology

## 2014-04-07 NOTE — Telephone Encounter (Signed)
, °

## 2014-05-23 ENCOUNTER — Other Ambulatory Visit: Payer: Self-pay | Admitting: Plastic Surgery

## 2014-05-24 ENCOUNTER — Encounter (HOSPITAL_COMMUNITY): Payer: Self-pay | Admitting: Pharmacy Technician

## 2014-05-25 NOTE — Pre-Procedure Instructions (Signed)
BEXLEE BERGDOLL  05/25/2014   Your procedure is scheduled on:  Thursday, August 6.  Report to [facility] Admitting at 5:30AM.  Call this number if you have problems the morning of surgery: [phone]   Remember:   Do not eat food or drink liquids after midnight Wednesday, August 5.   Take these medicines the morning of surgery with A SIP OF WATER: fexofenadine (ALLEGRA), tamoxifen (NOLVADEX), thyroid (ARMOUR), venlafaxine XR (EFFEXOR-XR).               Take if needed: rizatriptan (MAXALT-MLT), cyclobenzaprine (FLEXERIL), promethazine (PHENERGAN).               On July 30 stop taking all vitamins, herbal medications, Aspirin, Aspirin products and NSAIDS ( Aleve, Advil)     Do not wear jewelry, make-up or nail polish.  Do not wear lotions, powders, or perfumes.   Do not shave 48 hours prior to surgery.   Do not bring valuables to the hospital.             [facility] is not responsible for any belongings or valuables.               Contacts, dentures or bridgework may not be worn into surgery.  Leave suitcase in the car. After surgery it may be brought to your room.  For patients admitted to the hospital, discharge time is determined by your treatment team.               Patients discharged the day of surgery will not be allowed to drive home.  Name and phone number of your driver: -   Special Instructions: -   Please read over the following fact sheets that you were given: Pain Booklet, Coughing and Deep Breathing and Surgical Site Infection Prevention

## 2014-05-26 ENCOUNTER — Encounter (HOSPITAL_COMMUNITY): Payer: Self-pay

## 2014-05-26 ENCOUNTER — Encounter (HOSPITAL_COMMUNITY)
Admission: RE | Admit: 2014-05-26 | Discharge: 2014-05-26 | Disposition: A | Discharge status: Home / Self Care | Payer: 59 | Source: Ambulatory Visit | Attending: Plastic Surgery | Admitting: Plastic Surgery

## 2014-05-26 DIAGNOSIS — Z01812 Encounter for preprocedural laboratory examination: Secondary | ICD-10-CM | POA: Insufficient documentation

## 2014-05-26 DIAGNOSIS — Z01818 Encounter for other preprocedural examination: Secondary | ICD-10-CM | POA: Insufficient documentation

## 2014-05-26 HISTORY — DX: Urgency of urination: R39.15

## 2014-05-26 HISTORY — DX: Migraine, unspecified, not intractable, without status migrainosus: G43.909

## 2014-05-26 LAB — CBC
HEMATOCRIT: 37.3 % (ref 36.0–46.0)
HEMOGLOBIN: 12.7 g/dL (ref 12.0–15.0)
MCH: 32.3 pg (ref 26.0–34.0)
MCHC: 34 g/dL (ref 30.0–36.0)
MCV: 94.9 fL (ref 78.0–100.0)
Platelets: 161 10*3/uL (ref 150–400)
RBC: 3.93 MIL/uL (ref 3.87–5.11)
RDW: 11.9 % (ref 11.5–15.5)
WBC: 4.1 10*3/uL (ref 4.0–10.5)

## 2014-05-26 LAB — BASIC METABOLIC PANEL
Anion gap: 10 (ref 5–15)
BUN: 16 mg/dL (ref 6–23)
CHLORIDE: 104 meq/L (ref 96–112)
CO2: 29 meq/L (ref 19–32)
Calcium: 8.8 mg/dL (ref 8.4–10.5)
Creatinine, Ser: 0.72 mg/dL (ref 0.50–1.10)
GFR calc Af Amer: >90 mL/min (ref >90)
GFR calc non Af Amer: >90 mL/min (ref >90)
GLUCOSE: 82 mg/dL (ref 70–99)
POTASSIUM: 4.8 meq/L (ref 3.7–5.3)
SODIUM: 143 meq/L (ref 137–147)

## 2014-05-26 NOTE — Pre-Procedure Instructions (Signed)
DAISY LITES  05/26/2014   Your procedure is scheduled on:  Thursday June 08, 2014 at 7:30 AM.  Report to [facility] Admitting at 5:30AM.  Call this number if you have problems the morning of surgery: [phone]   Remember:   Do not eat food or drink liquids after midnight Wednesday, August 5.   Take these medicines the morning of surgery with A SIP OF WATER: fexofenadine (ALLEGRA), tamoxifen (NOLVADEX), thyroid (ARMOUR), venlafaxine XR (EFFEXOR-XR).               Take if needed: rizatriptan (MAXALT-MLT), cyclobenzaprine (FLEXERIL), promethazine (PHENERGAN).               On July 30 stop taking all vitamins, herbal medications, Aspirin, Aspirin products and NSAIDS ( Aleve, Advil, Fish Oil)   Do not wear jewelry, make-up or nail polish.  Do not wear lotions, powders, or perfumes.   Do not shave 48 hours prior to surgery.   Do not bring valuables to the hospital.             [facility] is not responsible for any belongings or valuables.               Contacts, dentures or bridgework may not be worn into surgery.  Leave suitcase in the car. After surgery it may be brought to your room.  For patients admitted to the hospital, discharge time is determined by your treatment team.               Patients discharged the day of surgery will not be allowed to drive home.  Name and phone number of your driver: Family/Friend   Special Instructions: Shower using CHG soap the night before and the morning of your surgery   Please read over the following fact sheets that you were given: Pain Booklet, Coughing and Deep Breathing and Surgical Site Infection Prevention

## 2014-05-26 NOTE — Progress Notes (Signed)
Patient denied having a stress test or cardiac cath, but informed Nurse that she had a sleep study approximately 5 years ago, but denied having sleep apnea.

## 2014-06-05 ENCOUNTER — Ambulatory Visit
Admission: RE | Admit: 2014-06-05 | Discharge: 2014-06-05 | Disposition: A | Discharge status: Home / Self Care | Payer: 59 | Source: Ambulatory Visit | Attending: Internal Medicine | Admitting: Internal Medicine

## 2014-06-05 ENCOUNTER — Other Ambulatory Visit: Payer: Self-pay | Admitting: Internal Medicine

## 2014-06-05 ENCOUNTER — Encounter (INDEPENDENT_AMBULATORY_CARE_PROVIDER_SITE_OTHER): Payer: Self-pay

## 2014-06-05 DIAGNOSIS — D0510 Intraductal carcinoma in situ of unspecified breast: Secondary | ICD-10-CM

## 2014-06-05 DIAGNOSIS — C50512 Malignant neoplasm of lower-outer quadrant of left female breast: Secondary | ICD-10-CM

## 2014-06-08 ENCOUNTER — Encounter (HOSPITAL_COMMUNITY): Admission: RE | Payer: Self-pay | Source: Ambulatory Visit

## 2014-06-08 ENCOUNTER — Ambulatory Visit (HOSPITAL_COMMUNITY): Admission: RE | Admit: 2014-06-08 | Payer: 59 | Source: Ambulatory Visit | Admitting: Plastic Surgery

## 2014-06-08 SURGERY — RECONSTRUCTION, BREAST
Anesthesia: General | Site: Breast | Laterality: Left

## 2014-06-19 ENCOUNTER — Other Ambulatory Visit: Payer: Self-pay | Admitting: Plastic Surgery

## 2014-07-17 ENCOUNTER — Encounter (HOSPITAL_COMMUNITY): Payer: Self-pay | Admitting: Pharmacy Technician

## 2014-07-19 ENCOUNTER — Encounter (HOSPITAL_COMMUNITY): Payer: Self-pay | Admitting: *Deleted

## 2014-07-19 MED ORDER — CHLORHEXIDINE GLUCONATE 4 % EX LIQD
1.0000 "application " | Freq: Once | CUTANEOUS | Status: DC
  Filled 2014-07-19: qty 15

## 2014-07-19 MED ORDER — CEFAZOLIN SODIUM-DEXTROSE 2-3 GM-% IV SOLR
2.0000 g | INTRAVENOUS | Status: AC
  Administered 2014-07-20: 2 g via INTRAVENOUS
  Filled 2014-07-19: qty 50

## 2014-07-19 MED ORDER — HEPARIN SODIUM (PORCINE) 5000 UNIT/ML IJ SOLN
5000.0000 [IU] | Freq: Once | INTRAMUSCULAR | Status: DC
  Filled 2014-07-19: qty 1

## 2014-07-20 ENCOUNTER — Encounter (HOSPITAL_COMMUNITY): Payer: Self-pay | Admitting: Certified Registered"

## 2014-07-20 ENCOUNTER — Encounter (HOSPITAL_COMMUNITY)
Admission: RE | Disposition: A | Discharge status: Home / Self Care | Payer: Self-pay | Source: Ambulatory Visit | Attending: Plastic Surgery

## 2014-07-20 ENCOUNTER — Ambulatory Visit (HOSPITAL_COMMUNITY): Payer: 59 | Admitting: Certified Registered"

## 2014-07-20 ENCOUNTER — Encounter (HOSPITAL_COMMUNITY): Payer: 59 | Admitting: Certified Registered"

## 2014-07-20 ENCOUNTER — Ambulatory Visit (HOSPITAL_COMMUNITY)
Admission: RE | Admit: 2014-07-20 | Discharge: 2014-07-20 | Disposition: A | Discharge status: Home / Self Care | Payer: 59 | Source: Ambulatory Visit | Attending: Plastic Surgery | Admitting: Plastic Surgery

## 2014-07-20 DIAGNOSIS — Z853 Personal history of malignant neoplasm of breast: Secondary | ICD-10-CM | POA: Diagnosis not present

## 2014-07-20 DIAGNOSIS — E039 Hypothyroidism, unspecified: Secondary | ICD-10-CM | POA: Diagnosis not present

## 2014-07-20 DIAGNOSIS — Z923 Personal history of irradiation: Secondary | ICD-10-CM | POA: Insufficient documentation

## 2014-07-20 DIAGNOSIS — T8544XA Capsular contracture of breast implant, initial encounter: Secondary | ICD-10-CM | POA: Diagnosis not present

## 2014-07-20 HISTORY — DX: Hypothyroidism, unspecified: E03.9

## 2014-07-20 HISTORY — PX: CAPSULECTOMY: SHX5381

## 2014-07-20 LAB — CBC
HCT: 34.7 % — ABNORMAL LOW (ref 36.0–46.0)
Hemoglobin: 12.2 g/dL (ref 12.0–15.0)
MCH: 33.1 pg (ref 26.0–34.0)
MCHC: 35.2 g/dL (ref 30.0–36.0)
MCV: 94 fL (ref 78.0–100.0)
PLATELETS: 169 10*3/uL (ref 150–400)
RBC: 3.69 MIL/uL — ABNORMAL LOW (ref 3.87–5.11)
RDW: 11.9 % (ref 11.5–15.5)
WBC: 3.8 10*3/uL — ABNORMAL LOW (ref 4.0–10.5)

## 2014-07-20 SURGERY — CAPSULECTOMY, BREAST
Anesthesia: General | Site: Breast | Laterality: Left

## 2014-07-20 MED ORDER — DIPHENHYDRAMINE HCL 50 MG/ML IJ SOLN
INTRAMUSCULAR | Status: AC
  Filled 2014-07-20: qty 1

## 2014-07-20 MED ORDER — PROPOFOL 10 MG/ML IV BOLUS
INTRAVENOUS | Status: DC | PRN
  Administered 2014-07-20: 100 mg via INTRAVENOUS
  Administered 2014-07-20: 30 mg via INTRAVENOUS

## 2014-07-20 MED ORDER — SUCCINYLCHOLINE CHLORIDE 20 MG/ML IJ SOLN
INTRAMUSCULAR | Status: AC
  Filled 2014-07-20: qty 1

## 2014-07-20 MED ORDER — ROCURONIUM BROMIDE 50 MG/5ML IV SOLN
INTRAVENOUS | Status: AC
  Filled 2014-07-20: qty 1

## 2014-07-20 MED ORDER — MIDAZOLAM HCL 2 MG/2ML IJ SOLN
INTRAMUSCULAR | Status: AC
  Filled 2014-07-20: qty 2

## 2014-07-20 MED ORDER — PHENYLEPHRINE 40 MCG/ML (10ML) SYRINGE FOR IV PUSH (FOR BLOOD PRESSURE SUPPORT)
PREFILLED_SYRINGE | INTRAVENOUS | Status: AC
Start: 2014-07-20 — End: 2014-07-20
  Filled 2014-07-20: qty 10

## 2014-07-20 MED ORDER — SODIUM CHLORIDE 0.9 % IJ SOLN
INTRAMUSCULAR | Status: AC
  Filled 2014-07-20: qty 10

## 2014-07-20 MED ORDER — PROMETHAZINE HCL 25 MG/ML IJ SOLN
6.2500 mg | INTRAMUSCULAR | Status: DC | PRN
  Administered 2014-07-20: 6.25 mg via INTRAVENOUS

## 2014-07-20 MED ORDER — PHENYLEPHRINE HCL 10 MG/ML IJ SOLN
INTRAMUSCULAR | Status: DC | PRN
  Administered 2014-07-20 (×2): 80 ug via INTRAVENOUS
  Administered 2014-07-20 (×2): 40 ug via INTRAVENOUS

## 2014-07-20 MED ORDER — ONDANSETRON HCL 4 MG/2ML IJ SOLN
INTRAMUSCULAR | Status: DC | PRN
  Administered 2014-07-20: 4 mg via INTRAVENOUS

## 2014-07-20 MED ORDER — DIPHENHYDRAMINE HCL 50 MG/ML IJ SOLN
10.0000 mg | Freq: Once | INTRAMUSCULAR | Status: AC
  Administered 2014-07-20: 10 mg via INTRAVENOUS

## 2014-07-20 MED ORDER — DEXAMETHASONE SODIUM PHOSPHATE 4 MG/ML IJ SOLN
INTRAMUSCULAR | Status: DC | PRN
  Administered 2014-07-20: 4 mg via INTRAVENOUS

## 2014-07-20 MED ORDER — LACTATED RINGERS IV SOLN
INTRAVENOUS | Status: DC | PRN
  Administered 2014-07-20: 07:00:00 via INTRAVENOUS

## 2014-07-20 MED ORDER — SODIUM CHLORIDE 0.9 % IV SOLN
Freq: Once | INTRAVENOUS | Status: AC
  Administered 2014-07-20: 08:00:00
  Filled 2014-07-20: qty 1

## 2014-07-20 MED ORDER — MIDAZOLAM HCL 5 MG/5ML IJ SOLN
INTRAMUSCULAR | Status: DC | PRN
  Administered 2014-07-20: 2 mg via INTRAVENOUS

## 2014-07-20 MED ORDER — MIDAZOLAM HCL 2 MG/2ML IJ SOLN: 0.5000 mg | Freq: Once | INTRAMUSCULAR | Status: DC | PRN

## 2014-07-20 MED ORDER — HYDROMORPHONE HCL 1 MG/ML IJ SOLN
INTRAMUSCULAR | Status: AC
  Administered 2014-07-20: 0.25 mg via INTRAVENOUS
  Filled 2014-07-20: qty 1

## 2014-07-20 MED ORDER — ARTIFICIAL TEARS OP OINT
TOPICAL_OINTMENT | OPHTHALMIC | Status: AC
  Filled 2014-07-20: qty 3.5

## 2014-07-20 MED ORDER — OXYCODONE HCL 5 MG PO TABS
ORAL_TABLET | ORAL | Status: AC
  Filled 2014-07-20: qty 1

## 2014-07-20 MED ORDER — NEOSTIGMINE METHYLSULFATE 10 MG/10ML IV SOLN
INTRAVENOUS | Status: AC
  Filled 2014-07-20: qty 1

## 2014-07-20 MED ORDER — ARTIFICIAL TEARS OP OINT
TOPICAL_OINTMENT | OPHTHALMIC | Status: DC | PRN
  Administered 2014-07-20: 1 via OPHTHALMIC

## 2014-07-20 MED ORDER — GLYCOPYRROLATE 0.2 MG/ML IJ SOLN
INTRAMUSCULAR | Status: DC | PRN
  Administered 2014-07-20: 0.4 mg via INTRAVENOUS

## 2014-07-20 MED ORDER — 0.9 % SODIUM CHLORIDE (POUR BTL) OPTIME
TOPICAL | Status: DC | PRN
  Administered 2014-07-20: 1000 mL

## 2014-07-20 MED ORDER — DEXAMETHASONE SODIUM PHOSPHATE 4 MG/ML IJ SOLN
INTRAMUSCULAR | Status: AC
  Filled 2014-07-20: qty 1

## 2014-07-20 MED ORDER — SCOPOLAMINE 1 MG/3DAYS TD PT72
MEDICATED_PATCH | TRANSDERMAL | Status: AC
  Filled 2014-07-20: qty 1

## 2014-07-20 MED ORDER — HEPARIN SODIUM (PORCINE) 5000 UNIT/ML IJ SOLN
5000.0000 [IU] | Freq: Once | INTRAMUSCULAR | Status: AC
  Administered 2014-07-20: 5000 [IU] via SUBCUTANEOUS

## 2014-07-20 MED ORDER — ONDANSETRON HCL 4 MG/2ML IJ SOLN
INTRAMUSCULAR | Status: AC
  Filled 2014-07-20: qty 2

## 2014-07-20 MED ORDER — OXYCODONE HCL 5 MG/5ML PO SOLN: 5.0000 mg | Freq: Once | ORAL | Status: AC | PRN

## 2014-07-20 MED ORDER — GLYCOPYRROLATE 0.2 MG/ML IJ SOLN
INTRAMUSCULAR | Status: AC
  Filled 2014-07-20: qty 2

## 2014-07-20 MED ORDER — LIDOCAINE HCL (CARDIAC) 20 MG/ML IV SOLN
INTRAVENOUS | Status: DC | PRN
  Administered 2014-07-20: 10 mg via INTRAVENOUS

## 2014-07-20 MED ORDER — CEFAZOLIN SODIUM-DEXTROSE 2-3 GM-% IV SOLR: 2.0000 g | INTRAVENOUS | Status: DC

## 2014-07-20 MED ORDER — EPHEDRINE SULFATE 50 MG/ML IJ SOLN
INTRAMUSCULAR | Status: DC | PRN
  Administered 2014-07-20: 5 mg via INTRAVENOUS
  Administered 2014-07-20 (×2): 10 mg via INTRAVENOUS

## 2014-07-20 MED ORDER — CHLORHEXIDINE GLUCONATE 4 % EX LIQD
1.0000 "application " | Freq: Once | CUTANEOUS | Status: DC
  Filled 2014-07-20: qty 15

## 2014-07-20 MED ORDER — FENTANYL CITRATE 0.05 MG/ML IJ SOLN
INTRAMUSCULAR | Status: AC
  Filled 2014-07-20: qty 5

## 2014-07-20 MED ORDER — LIDOCAINE HCL (CARDIAC) 20 MG/ML IV SOLN
INTRAVENOUS | Status: AC
  Filled 2014-07-20: qty 5

## 2014-07-20 MED ORDER — HYDROMORPHONE HCL 1 MG/ML IJ SOLN
0.2500 mg | INTRAMUSCULAR | Status: DC | PRN
  Administered 2014-07-20: 0.25 mg via INTRAVENOUS
  Administered 2014-07-20: 0.5 mg via INTRAVENOUS
  Administered 2014-07-20: 0.25 mg via INTRAVENOUS

## 2014-07-20 MED ORDER — PROMETHAZINE HCL 25 MG/ML IJ SOLN
INTRAMUSCULAR | Status: AC
  Filled 2014-07-20: qty 1

## 2014-07-20 MED ORDER — EPHEDRINE SULFATE 50 MG/ML IJ SOLN
INTRAMUSCULAR | Status: AC
  Filled 2014-07-20: qty 1

## 2014-07-20 MED ORDER — SCOPOLAMINE 1 MG/3DAYS TD PT72SCOPOLAMINE 1 MG/3DAYS
1.0000 | MEDICATED_PATCH | Freq: Once | TRANSDERMAL | Status: AC
Start: 2014-07-20 — End: 2014-07-20
  Administered 2014-07-20: 1 via TRANSDERMAL

## 2014-07-20 MED ORDER — OXYCODONE HCL 5 MG PO TABS
5.0000 mg | ORAL_TABLET | Freq: Once | ORAL | Status: AC | PRN
  Administered 2014-07-20: 5 mg via ORAL

## 2014-07-20 MED ORDER — MEPERIDINE HCL 25 MG/ML IJ SOLN: 6.2500 mg | INTRAMUSCULAR | Status: DC | PRN

## 2014-07-20 MED ORDER — FENTANYL CITRATE 0.05 MG/ML IJ SOLN
INTRAMUSCULAR | Status: DC | PRN
  Administered 2014-07-20: 100 ug via INTRAVENOUS

## 2014-07-20 MED ORDER — PROPOFOL 10 MG/ML IV BOLUS
INTRAVENOUS | Status: AC
  Filled 2014-07-20: qty 20

## 2014-07-20 MED ORDER — NEOSTIGMINE METHYLSULFATE 10 MG/10ML IV SOLN
INTRAVENOUS | Status: DC | PRN
  Administered 2014-07-20: 3 mg via INTRAVENOUS

## 2014-07-20 MED ORDER — ROCURONIUM BROMIDE 100 MG/10ML IV SOLN
INTRAVENOUS | Status: DC | PRN
  Administered 2014-07-20: 10 mg via INTRAVENOUS
  Administered 2014-07-20: 35 mg via INTRAVENOUS
  Administered 2014-07-20: 15 mg via INTRAVENOUS
  Administered 2014-07-20: 10 mg via INTRAVENOUS

## 2014-07-20 SURGICAL SUPPLY — 55 items
ADH SKN CLS APL DERMABOND .7 (GAUZE/BANDAGES/DRESSINGS) ×1
ATCH SMKEVC FLXB CAUT HNDSWH (FILTER) IMPLANT
BAG DECANTER FOR FLEXI CONT (MISCELLANEOUS) ×3 IMPLANT
BANDAGE ELASTIC 6 VELCRO ST LF (GAUZE/BANDAGES/DRESSINGS) ×2 IMPLANT
BIOPATCH RED 1 DISK 7.0 (GAUZE/BANDAGES/DRESSINGS) ×2 IMPLANT
BIOPATCH RED 1IN DISK 7.0MM (GAUZE/BANDAGES/DRESSINGS) ×1
BLADE SURG 10 STRL SS (BLADE) ×3 IMPLANT
CANISTER SUCTION 2500CC (MISCELLANEOUS) ×3 IMPLANT
CHLORAPREP W/TINT 26ML (MISCELLANEOUS) ×3 IMPLANT
CONT SPEC 4OZ CLIKSEAL STRL BL (MISCELLANEOUS) ×2 IMPLANT
COVER SURGICAL LIGHT HANDLE (MISCELLANEOUS) ×3 IMPLANT
DERMABOND ADVANCED (GAUZE/BANDAGES/DRESSINGS) ×2
DERMABOND ADVANCED .7 DNX12 (GAUZE/BANDAGES/DRESSINGS) IMPLANT
DRAIN CHANNEL 19F RND (DRAIN) ×3 IMPLANT
DRAPE LAPAROSCOPIC ABDOMINAL (DRAPES) ×3 IMPLANT
DRSG PAD ABDOMINAL 8X10 ST (GAUZE/BANDAGES/DRESSINGS) ×9 IMPLANT
DRSG TEGADERM 4X4.75 (GAUZE/BANDAGES/DRESSINGS) ×2 IMPLANT
ELECT BLADE 4.0 EZ CLEAN MEGAD (MISCELLANEOUS) ×3
ELECT REM PT RETURN 9FT ADLT (ELECTROSURGICAL) ×3
ELECTRODE BLDE 4.0 EZ CLN MEGD (MISCELLANEOUS) IMPLANT
ELECTRODE REM PT RTRN 9FT ADLT (ELECTROSURGICAL) ×1 IMPLANT
EVACUATOR SILICONE 100CC (DRAIN) ×3 IMPLANT
EVACUATOR SMOKE ACCUVAC VALLEY (FILTER) ×2
GAUZE SPONGE 4X4 12PLY STRL (GAUZE/BANDAGES/DRESSINGS) ×3 IMPLANT
GLOVE BIO SURGEON STRL SZ7 (GLOVE) ×6 IMPLANT
GLOVE BIO SURGEON STRL SZ7.5 (GLOVE) ×3 IMPLANT
GLOVE BIO SURGEON STRL SZ8 (GLOVE) ×2 IMPLANT
GLOVE BIOGEL PI IND STRL 6.5 (GLOVE) IMPLANT
GLOVE BIOGEL PI IND STRL 8 (GLOVE) ×1 IMPLANT
GLOVE BIOGEL PI INDICATOR 6.5 (GLOVE) ×2
GLOVE BIOGEL PI INDICATOR 8 (GLOVE) ×4
GLOVE SURG SS PI 6.5 STRL IVOR (GLOVE) ×2 IMPLANT
GOWN STRL REUS W/ TWL LRG LVL3 (GOWN DISPOSABLE) ×1 IMPLANT
GOWN STRL REUS W/ TWL XL LVL3 (GOWN DISPOSABLE) ×1 IMPLANT
GOWN STRL REUS W/TWL LRG LVL3 (GOWN DISPOSABLE) ×6
GOWN STRL REUS W/TWL XL LVL3 (GOWN DISPOSABLE) ×3
IMPL BREAST RND GEL 300CC (Breast) IMPLANT
IMPLANT BREAST RND GEL 300CC (Breast) ×3 IMPLANT
KIT BASIN OR (CUSTOM PROCEDURE TRAY) ×3 IMPLANT
KIT ROOM TURNOVER OR (KITS) ×3 IMPLANT
NS IRRIG 1000ML POUR BTL (IV SOLUTION) ×4 IMPLANT
PACK GENERAL/GYN (CUSTOM PROCEDURE TRAY) ×3 IMPLANT
PAD ABD 8X10 STRL (GAUZE/BANDAGES/DRESSINGS) ×2 IMPLANT
PAD ARMBOARD 7.5X6 YLW CONV (MISCELLANEOUS) ×3 IMPLANT
PREFILTER EVAC NS 1 1/3-3/8IN (MISCELLANEOUS) ×3 IMPLANT
STAPLER VISISTAT 35W (STAPLE) ×2 IMPLANT
SUT ETHILON 3 0 FSL (SUTURE) ×2 IMPLANT
SUT MNCRL AB 3-0 PS2 18 (SUTURE) ×6 IMPLANT
SUT MNCRL AB 4-0 PS2 18 (SUTURE) ×2 IMPLANT
SUT PROLENE 3 0 PS 1 (SUTURE) ×2 IMPLANT
SUT VIC AB 3-0 SH 18 (SUTURE) ×3 IMPLANT
TOWEL OR 17X24 6PK STRL BLUE (TOWEL DISPOSABLE) ×3 IMPLANT
TOWEL OR 17X26 10 PK STRL BLUE (TOWEL DISPOSABLE) ×3 IMPLANT
TUBE CONNECTING 12'X1/4 (SUCTIONS) ×1
TUBE CONNECTING 12X1/4 (SUCTIONS) ×1 IMPLANT

## 2014-07-20 NOTE — Progress Notes (Signed)
Dr Glennon Mac called, informed that patient is having severe back spasms.  Dr. Glennon Mac at bedside. Patient also reports nausea, ordered to give the Phenergan for nausea.

## 2014-07-20 NOTE — Anesthesia Postprocedure Evaluation (Signed)
  Anesthesia Post-op Note  Patient: [name]  Procedure(s) Performed: Procedure(s): LEFT BREAST CAPSULECTOMY/REPLACE IMPLANT FOR RECONSTRUCTION OF LEFT BREAST (Left)  Patient Location: PACU  Anesthesia Type:General  Level of Consciousness: awake, alert , oriented and patient cooperative  Airway and Oxygen Therapy: Patient Spontanous Breathing  Post-op Pain: mild  Post-op Assessment: Post-op Vital signs reviewed, Patient's Cardiovascular Status Stable, Respiratory Function Stable, Patent Airway, No signs of Nausea or vomiting and Pain level controlled  Post-op Vital Signs: Reviewed and stable  Last Vitals:  Filed Vitals:   07/20/14 1430  BP:   Pulse: 67  Temp:   Resp:     Complications: No apparent anesthesia complications

## 2014-07-20 NOTE — Op Note (Signed)
[name], [name] NO.:  [id number]  MEDICAL RECORD NO.:  [id number]  LOCATION:  [facility]                         FACILITY:  [facility]  PHYSICIAN:  Crissie Reese, M.D.     DATE OF BIRTH:  12/20/1965  DATE OF PROCEDURE:  07/20/2014 DATE OF DISCHARGE:                              OPERATIVE REPORT   PREOPERATIVE DIAGNOSES: 1. Breast cancer. 2. Severe capsular contracture, left breast reconstruction. 3. Radiation affects chest wall.  POSTOPERATIVE DIAGNOSES: 1. Breast cancer. 2. Severe capsular contracture, left breast reconstruction. 3. Radiation affects chest wall.  PROCEDURE PERFORMED:  Left breast total capsulectomy and delayed reconstruction with a silicone gel implant.  SURGEON:  Crissie Reese, MD  ANESTHESIA:  General.  ESTIMATED BLOOD LOSS:  20 mL.  DRAINS:  One 19-French.  CLINICAL NOTE:  A 48 year old woman has had lumpectomy and radiation to the left breast and had a poor aesthetic outcome.  She underwent breast reconstruction and fat grafting to the lumpectomy site.  She subsequently did well from this surgery but then has developed a significant capsular contracture on the left side due to the previous radiation.  The right side where she had an augmentation for symmetry has done extremely well and has remained soft.  The procedure was discussed with her.  It is then well over 6 months since her reconstructive surgery.  The risk of recurrent capsular contracture was discussed.  A total capsulectomy was planned and replacement of the silicone gel implant as a delayed reconstruction.  She understood that the JP drain placed.  Risks plus complications were discussed included but are not limited to, bleeding, infection, healing problems, scarring, loss of sensation, loss of sensation in nipple, fluid accumulations, anesthesia-related complications, pneumothorax, DVT, PE, failure of device, capsular contracture, displacement of device, wrinkles  and ripples, contour deformities, asymmetry, chronic pain, and overall disappointment.  She understood all of this and wished to proceed.  DESCRIPTION OF PROCEDURE:  The patient was marked in the holding area in a full standing position.  She was in the operating room and placed supine after successful induction of general anesthesia.  She was prepped with ChloraPrep.  After waiting full 3 minutes for drying, she was draped with impervious drapes and all of which were sterile drapes. The nipple shields were utilized for this procedure.  Incision was then made through the old inframammary crease scar and dissection carried into the subcutaneous tissue.  The underlying implant was identified and removed.  The capsule was then identified and it was then removed. Great care was taken to avoid damage to the overlying pectoralis major muscle and to the underlying chest cavity.  Having completed capsulectomy, thorough irrigation with saline and then antibiotic solution, the solution was allowed to dwell on the space for at least 5 minutes.  A 19-French drain was positioned, brought through separate stab wound inferolaterally and secured with a 3-0 Prolene suture.  The skin was then prepped with Betadine around the wound edges and the new implant soaked in antibiotic solution.  It was decided to go ahead with 300 mL replacement because that did seem to give her a good symmetry of volume on the previous surgery.  Excellent hemostasis was confirmed. Antibiotic solution was placed in the space.  After thoroughly cleaning gloves, the implant was then positioned.  Care was to make sure that it was oriented properly and that it was in the inferior aspect of the submuscular pocket.  Antibiotic solution was left in the space during the closure and the 3-0 Vicryl simple interrupted sutures were placed but left untied in order to place the implant under direct vision at all times and great care taken to  avoid damage to underlying implant.  These were then tied securely and then the wound irrigated and then 3-0 Monocryl inverted deep dermal sutures and 4-0 Monocryl running subcuticular suture with a few interspersed 3-0 Prolene interrupted sutures for reinforcement.  Dermabond applied.  A Biopatch was placed around the drain and a dry sterile Tegaderm over this and then ABDs were positioned and an Ace wrap was wrapped superiorly in order to keep the implant in the inferior aspect of the space.  She was transferred to the recovery room stable having tolerated the procedure well.  DISPOSITION:  She will follow up in the office tomorrow morning.     Crissie Reese, M.D.     DB/MEDQ  D:  07/20/2014  T:  07/20/2014  Job:  [id number]

## 2014-07-20 NOTE — Discharge Instructions (Addendum)
No lifting for 6 weeks No vigorous activity for 6 weeks (including outdoor walks) No driving for 4 weeks OK to walk up stairs slowly Stay propped up Use incentive spirometer at home every hour while awake No shower while drains are in place Empty drains at least three times a day and record the amounts separately Change drain dressings every third day if instructed to do so by Dr. Harlow Mares  Apply Bacitracin antibiotic ointment to the drain sites  Place gauze dressing over drains  Secure the gauze with tape Take an over-the-counter Probiotic while on antibiotics Take an over-the-counter stool softener (such as Colace) while on pain medication See Dr. Harlow Mares in office tomorrow morning (07/21/2014) at 11:45. Bring a sports bra with you. For questions call [phone] or [phone]   What to eat:  For your first meals, you should eat lightly; only small meals initially.  If you do not have nausea, you may eat larger meals.  Avoid spicy, greasy and heavy food.    General Anesthesia, Adult, Care After  Refer to this sheet in the next few weeks. These instructions provide you with information on caring for yourself after your procedure. Your health care provider may also give you more specific instructions. Your treatment has been planned according to current medical practices, but problems sometimes occur. Call your health care provider if you have any problems or questions after your procedure.  WHAT TO EXPECT AFTER THE PROCEDURE  After the procedure, it is typical to experience:  Sleepiness.  Nausea and vomiting. HOME CARE INSTRUCTIONS  For the first 24 hours after general anesthesia:  Have a responsible person with you.  Do not drive a car. If you are alone, do not take public transportation.  Do not drink alcohol.  Do not take medicine that has not been prescribed by your health care provider.  Do not sign important papers or make important decisions.  You may resume a normal diet and  activities as directed by your health care provider.  Change bandages (dressings) as directed.  If you have questions or problems that seem related to general anesthesia, call the hospital and ask for the anesthetist or anesthesiologist on call. SEEK MEDICAL CARE IF:  You have nausea and vomiting that continue the day after anesthesia.  You develop a rash. SEEK IMMEDIATE MEDICAL CARE IF:  You have difficulty breathing.  You have chest pain.  You have any allergic problems. Document Released: 01/26/2001 Document Revised: 06/22/2013 Document Reviewed: 05/05/2013  [facility] Patient Information 2014 [place], [place].      >shaa

## 2014-07-20 NOTE — Progress Notes (Signed)
Pt bp ranging 84/56 to 77/42.  Dr. Glennon Mac paged. Ordered to give Phenergan for nausea.

## 2014-07-20 NOTE — Anesthesia Procedure Notes (Signed)
Procedure Name: Intubation Date/Time: 07/20/2014 7:41 AM Performed by: Barrington Ellison Pre-anesthesia Checklist: Patient identified, Emergency Drugs available, Suction available, Patient being monitored and Timeout performed Patient Re-evaluated:Patient Re-evaluated prior to inductionOxygen Delivery Method: Circle system utilized Preoxygenation: Pre-oxygenation with 100% oxygen Intubation Type: IV induction Ventilation: Mask ventilation without difficulty Laryngoscope Size: Mac and 3 Grade View: Grade I Tube type: Oral Tube size: 7.0 mm Number of attempts: 1 Airway Equipment and Method: Stylet Placement Confirmation: ETT inserted through vocal cords under direct vision,  positive ETCO2 and breath sounds checked- equal and bilateral Secured at: 21 cm Tube secured with: Tape Dental Injury: Teeth and Oropharynx as per pre-operative assessment

## 2014-07-20 NOTE — H&P (Signed)
I have re-examined and re-evaluated the patient and there are no changes. See office notes H&P in paper chart.  Planned Procedure: Left capsulectomy and delayed breast reconstruction with implant.

## 2014-07-20 NOTE — Transfer of Care (Signed)
Immediate Anesthesia Transfer of Care Note  Patient: [name]  Procedure(s) Performed: Procedure(s): LEFT BREAST CAPSULECTOMY/REPLACE IMPLANT FOR RECONSTRUCTION OF LEFT BREAST (Left)  Patient Location: PACU  Anesthesia Type:General  Level of Consciousness: lethargic and responds to stimulation  Airway & Oxygen Therapy: Patient Spontanous Breathing and Patient connected to nasal cannula oxygen  Post-op Assessment: Report given to PACU RN  Post vital signs: Reviewed and stable  Complications: No apparent anesthesia complications

## 2014-07-20 NOTE — Brief Op Note (Signed)
07/20/2014  9:41 AM  PATIENT:  [name]  48 y.o. female  PRE-OPERATIVE DIAGNOSIS:  breast cancer  POST-OPERATIVE DIAGNOSIS:  breast cancer  PROCEDURE:  Procedure(s): LEFT BREAST CAPSULECTOMY/REPLACE IMPLANT FOR RECONSTRUCTION OF LEFT BREAST (Left)  SURGEON:  Surgeon(s) and Role:    * Crissie Reese, MD - Primary  PHYSICIAN ASSISTANT:   ASSISTANTS: none   ANESTHESIA:   general  EBL:  Total I/O In: 950 [I.V.:950] Out: -   BLOOD ADMINISTERED:none  DRAINS: (1) Jackson-Pratt drain(s) with closed bulb suction in the left chest   LOCAL MEDICATIONS USED:  NONE  SPECIMEN:  Source of Specimen:  left chest capsule  DISPOSITION OF SPECIMEN:  PATHOLOGY  COUNTS:  YES  TOURNIQUET:  * No tourniquets in log *  DICTATION: .Other Dictation: Dictation Number [id number]  PLAN OF CARE: Discharge to home after PACU  PATIENT DISPOSITION:  PACU - hemodynamically stable.   Delay start of Pharmacological VTE agent (>24hrs) due to surgical blood loss or risk of bleeding: not applicable

## 2014-07-20 NOTE — Anesthesia Preprocedure Evaluation (Addendum)
Anesthesia Evaluation  Patient identified by MRN, date of birth, ID band Patient awake    Reviewed: Allergy & Precautions, H&P , NPO status , Patient's Chart, lab work & pertinent test results  History of Anesthesia Complications Negative for: history of anesthetic complications  Airway Mallampati: III TM Distance: <3 FB Neck ROM: Full    Dental  (+) Teeth Intact, Dental Advisory Given   Pulmonary neg pulmonary ROS,  breath sounds clear to auscultation        Cardiovascular - anginanegative cardio ROS  Rhythm:Regular Rate:Normal     Neuro/Psych  Headaches,    GI/Hepatic negative GI ROS, Neg liver ROS,   Endo/Other  Hypothyroidism   Renal/GU negative Renal ROS     Musculoskeletal   Abdominal   Peds  Hematology negative hematology ROS (+)   Anesthesia Other Findings DCIS  Reproductive/Obstetrics                         Anesthesia Physical Anesthesia Plan  ASA: II  Anesthesia Plan: General   Post-op Pain Management:    Induction: Intravenous  Airway Management Planned: Oral ETT  Additional Equipment:   Intra-op Plan:   Post-operative Plan: Extubation in OR  Informed Consent: I have reviewed the patients History and Physical, chart, labs and discussed the procedure including the risks, benefits and alternatives for the proposed anesthesia with the patient or authorized representative who has indicated his/her understanding and acceptance.   Dental advisory given  Plan Discussed with: CRNA and Surgeon  Anesthesia Plan Comments: (Plan routine monitors, GETA)        Anesthesia Quick Evaluation

## 2014-07-24 ENCOUNTER — Encounter (HOSPITAL_COMMUNITY): Payer: Self-pay | Admitting: Plastic Surgery

## 2014-08-04 ENCOUNTER — Encounter: Payer: Self-pay | Admitting: Gynecology

## 2014-08-04 ENCOUNTER — Ambulatory Visit (INDEPENDENT_AMBULATORY_CARE_PROVIDER_SITE_OTHER): Payer: 59 | Admitting: Gynecology

## 2014-08-04 VITALS — BP 118/72 | Ht 64.0 in | Wt 112.0 lb

## 2014-08-04 DIAGNOSIS — R102 Pelvic and perineal pain: Secondary | ICD-10-CM

## 2014-08-04 DIAGNOSIS — N898 Other specified noninflammatory disorders of vagina: Secondary | ICD-10-CM

## 2014-08-04 DIAGNOSIS — N809 Endometriosis, unspecified: Secondary | ICD-10-CM

## 2014-08-04 LAB — URINALYSIS W MICROSCOPIC + REFLEX CULTURE
Bilirubin Urine: NEGATIVE
Glucose, UA: NEGATIVE mg/dL
Hgb urine dipstick: NEGATIVE
Ketones, ur: NEGATIVE mg/dL
LEUKOCYTES UA: NEGATIVE
Nitrite: NEGATIVE
PH: 7 (ref 5.0–8.0)
Protein, ur: NEGATIVE mg/dL
SPECIFIC GRAVITY, URINE: 1.01 (ref 1.005–1.030)
Urobilinogen, UA: 0.2 mg/dL (ref 0.0–1.0)

## 2014-08-04 LAB — WET PREP FOR TRICH, YEAST, CLUE
CLUE CELLS WET PREP: NONE SEEN
TRICH WET PREP: NONE SEEN
Yeast Wet Prep HPF POC: NONE SEEN

## 2014-08-04 MED ORDER — FLUCONAZOLE 200 MG PO TABS: 200.0000 mg | ORAL_TABLET | Freq: Every day | ORAL | Status: DC

## 2014-08-04 NOTE — Patient Instructions (Signed)
Take the Diflucan pill. Follow up if the vaginal discharge continues.  Follow up for the ultrasound as scheduled.

## 2014-08-04 NOTE — Progress Notes (Signed)
[name] 1966-03-15 [id number]        48 y.o.  Former patient who has not been seen in the office for a number of years. Routinely obtains her general. medical care and gynecologic care through her primary physician Dr. Dema Severin. Notes over the past several years coming and going lower pelvic discomfort left greater than right. When she was menstruating seemed time to her menses but now sporadically happens throughout the month. Seems to be tied to diet particularly when she eats more sugar seems to cause the discomfort. No diarrhea constipation nausea vomiting dysuria frequency urgency. Last menstrual period 2 years ago with no bleeding since. Taking tamoxifen for breast cancer. History of endometriosis laparoscopic proven 1996.  Also notes a slight vaginal discharge over the last week or 2 with some itching.  Current on her health screening.  Past medical history,surgical history, problem list, medications, allergies, family history and social history were all reviewed and documented in the EPIC chart.  Directed ROS with pertinent positives and negatives documented in the history of present illness/assessment and plan.  Exam: Kim assistant General appearance:  Normal Spine straight without CVA tenderness Abdomen soft nontender without masses guarding rebound Pelvic external BUS vagina with white discharge. Cervix normal. Uterus normal size midline mobile nontender. Adnexa without masses or tenderness. Rectal exam normal.  Assessment/Plan:  48 y.o. several year history of lower abdominal pelvic pain left greater than right. History of endometriosis in the past. Postmenopausal by 2 years with no bleeding. Historically her discomfort sounds more GI as it seems to be related to food. Urinalysis is negative today. We'll start with GYN ultrasound to rule out nonpalpable abnormalities. Possibilities up to and including diagnostic laparoscopy/referral to gastroenterology discussed. Also having slight  white discharge. Wet prep is unremarkable. Given the symptoms to include itching am going to cover her with Diflucan 200 mg x1 dose.  Follow up if symptoms persist, worsen or recur.     Anastasio Auerbach MD, 2:55 PM 08/04/2014

## 2014-08-15 ENCOUNTER — Encounter: Payer: Self-pay | Admitting: Gynecology

## 2014-09-01 ENCOUNTER — Other Ambulatory Visit: Payer: 59

## 2014-09-01 ENCOUNTER — Ambulatory Visit: Payer: 59 | Admitting: Gynecology

## 2014-09-06 ENCOUNTER — Ambulatory Visit (INDEPENDENT_AMBULATORY_CARE_PROVIDER_SITE_OTHER): Payer: 59 | Admitting: Gynecology

## 2014-09-06 ENCOUNTER — Ambulatory Visit (INDEPENDENT_AMBULATORY_CARE_PROVIDER_SITE_OTHER): Payer: 59

## 2014-09-06 ENCOUNTER — Encounter: Payer: Self-pay | Admitting: Gynecology

## 2014-09-06 DIAGNOSIS — R102 Pelvic and perineal pain: Secondary | ICD-10-CM

## 2014-09-06 NOTE — Patient Instructions (Signed)
Follow up with the gastroenterologist in reference to the abdominal discomfort.

## 2014-09-06 NOTE — Progress Notes (Signed)
[name] [birth year] [id number]        48 y.o.  Presents for ultrasound with history per her 08/04/2014 office visit "Former patient who has not been seen in the office for a number of years. Routinely obtains her general. medical care and gynecologic care through her primary physician Dr. Dema Severin. Notes over the past several years coming and going lower pelvic discomfort left greater than right. When she was menstruating seemed time to her menses but now sporadically happens throughout the month. Seems to be tied to diet particularly when she eats more sugar seems to cause the discomfort. No diarrhea constipation nausea vomiting dysuria frequency urgency. Last menstrual period 2 years ago with no bleeding since. Taking tamoxifen for breast cancer. History of endometriosis laparoscopic proven 1996."     Past medical history,surgical history, problem list, medications, allergies, family history and social history were all reviewed and documented in the EPIC chart.  Directed ROS with pertinent positives and negatives documented in the history of present illness/assessment and plan.  Ultrasound shows uterus normal size and heterogeneous echotexture. The myometrial echo 3.3 mm. Right and left ovaries atrophic in appearance. Cul-de-sac negative.  Assessment/Plan:  48 y.o. with above history. Ultrasound is negative. I suspect her discomfort is probably more GI. Recommend that she follow up with gastroenterologist for evaluation. If her pain would continue my next step would be laparoscopy which I discussed with her. She does have a history of endometriosis although she is postmenopausal now and I would suspect that any discomfort from this should be resolving. Patient will follow up with me if her pain persists and the gastroenterologist does not feel that it is GI related. Did have a negative urinalysis at her last visit.     Anastasio Auerbach MD, 3:36 PM 09/06/2014

## 2014-09-25 ENCOUNTER — Telehealth: Payer: Self-pay

## 2014-09-25 NOTE — Telephone Encounter (Signed)
Called her back and left detailed message with Dr. Dorette Grate reply. Call as needed.

## 2014-09-25 NOTE — Telephone Encounter (Signed)
Patient was in on 09/06/14 and had u/s.  She said it showed "no tumor".  She wondered if her symptoms could be caused by endometriosis and would you have seen evidence of that on u/s?

## 2014-09-25 NOTE — Telephone Encounter (Signed)
Ultrasound would not show small areas of endometriosis. The only way to diagnose that is through surgery with laparoscopy. I think the plan was for her to follow up with a gastroenterologist. If she continues to have pain and they do not feel it is GI then at some point we may have to have that discussion as far as surgery.

## 2014-12-25 ENCOUNTER — Other Ambulatory Visit: Payer: Self-pay | Admitting: Oncology

## 2015-03-29 ENCOUNTER — Other Ambulatory Visit: Payer: Self-pay | Admitting: *Deleted

## 2015-03-29 DIAGNOSIS — D0510 Intraductal carcinoma in situ of unspecified breast: Secondary | ICD-10-CM

## 2015-03-30 ENCOUNTER — Other Ambulatory Visit: Payer: 59

## 2015-04-04 HISTORY — PX: BREAST SURGERY: SHX581

## 2015-04-05 ENCOUNTER — Telehealth: Payer: Self-pay | Admitting: Hematology and Oncology

## 2015-04-05 NOTE — Telephone Encounter (Signed)
Patient called in to cancel her appointment and will call back to reschedule °

## 2015-04-06 ENCOUNTER — Ambulatory Visit: Payer: Self-pay | Admitting: Hematology and Oncology

## 2015-04-11 ENCOUNTER — Other Ambulatory Visit: Payer: Self-pay | Admitting: *Deleted

## 2015-04-11 DIAGNOSIS — D0510 Intraductal carcinoma in situ of unspecified breast: Secondary | ICD-10-CM

## 2015-04-11 MED ORDER — TAMOXIFEN CITRATE 20 MG PO TABS: 20.0000 mg | ORAL_TABLET | Freq: Every day | ORAL | Status: DC

## 2015-04-19 ENCOUNTER — Telehealth: Payer: Self-pay | Admitting: Hematology and Oncology

## 2015-04-19 NOTE — Telephone Encounter (Signed)
Patient called in to reschedule her cancelled appointments

## 2015-04-24 ENCOUNTER — Other Ambulatory Visit: Payer: Self-pay | Admitting: Family Medicine

## 2015-04-24 DIAGNOSIS — R200 Anesthesia of skin: Secondary | ICD-10-CM

## 2015-05-22 ENCOUNTER — Other Ambulatory Visit (HOSPITAL_BASED_OUTPATIENT_CLINIC_OR_DEPARTMENT_OTHER): Payer: 59

## 2015-05-22 ENCOUNTER — Ambulatory Visit (HOSPITAL_BASED_OUTPATIENT_CLINIC_OR_DEPARTMENT_OTHER): Payer: 59 | Admitting: Hematology and Oncology

## 2015-05-22 ENCOUNTER — Telehealth: Payer: Self-pay | Admitting: Hematology and Oncology

## 2015-05-22 ENCOUNTER — Encounter: Payer: Self-pay | Admitting: Hematology and Oncology

## 2015-05-22 VITALS — BP 101/61 | HR 70 | Temp 98.1°F | Resp 18 | Ht 64.0 in | Wt 110.0 lb

## 2015-05-22 DIAGNOSIS — D0510 Intraductal carcinoma in situ of unspecified breast: Secondary | ICD-10-CM

## 2015-05-22 DIAGNOSIS — D0512 Intraductal carcinoma in situ of left breast: Secondary | ICD-10-CM

## 2015-05-22 DIAGNOSIS — C50912 Malignant neoplasm of unspecified site of left female breast: Secondary | ICD-10-CM

## 2015-05-22 DIAGNOSIS — Z170 Estrogen receptor positive status [ER+]: Secondary | ICD-10-CM

## 2015-05-22 LAB — COMPREHENSIVE METABOLIC PANEL (CC13)
ALT: 12 U/L (ref 0–55)
AST: 17 U/L (ref 5–34)
Albumin: 3.5 g/dL (ref 3.5–5.0)
Alkaline Phosphatase: 48 U/L (ref 40–150)
Anion Gap: 4 mEq/L (ref 3–11)
BILIRUBIN TOTAL: 0.26 mg/dL (ref 0.20–1.20)
BUN: 15.9 mg/dL (ref 7.0–26.0)
CHLORIDE: 107 meq/L (ref 98–109)
CO2: 30 mEq/L — ABNORMAL HIGH (ref 22–29)
Calcium: 9 mg/dL (ref 8.4–10.4)
Creatinine: 0.8 mg/dL (ref 0.6–1.1)
EGFR: 89 mL/min/{1.73_m2} — AB (ref >90)
GLUCOSE: 94 mg/dL (ref 70–140)
Potassium: 4.3 mEq/L (ref 3.5–5.1)
Sodium: 141 mEq/L (ref 136–145)
Total Protein: 6.5 g/dL (ref 6.4–8.3)

## 2015-05-22 LAB — CBC WITH DIFFERENTIAL/PLATELET
BASO%: 0.5 % (ref 0.0–2.0)
BASOS ABS: 0 10*3/uL (ref 0.0–0.1)
EOS%: 2.7 % (ref 0.0–7.0)
Eosinophils Absolute: 0.1 10*3/uL (ref 0.0–0.5)
HEMATOCRIT: 34.3 % — AB (ref 34.8–46.6)
HGB: 11.8 g/dL (ref 11.6–15.9)
LYMPH%: 30.6 % (ref 14.0–49.7)
MCH: 32.7 pg (ref 25.1–34.0)
MCHC: 34.3 g/dL (ref 31.5–36.0)
MCV: 95.4 fL (ref 79.5–101.0)
MONO#: 0.4 10*3/uL (ref 0.1–0.9)
MONO%: 9.9 % (ref 0.0–14.0)
NEUT%: 56.3 % (ref 38.4–76.8)
NEUTROS ABS: 2.4 10*3/uL (ref 1.5–6.5)
Platelets: 155 10*3/uL (ref 145–400)
RBC: 3.6 10*6/uL — AB (ref 3.70–5.45)
RDW: 12 % (ref 11.2–14.5)
WBC: 4.3 10*3/uL (ref 3.9–10.3)
lymph#: 1.3 10*3/uL (ref 0.9–3.3)

## 2015-05-22 MED ORDER — TAMOXIFEN CITRATE 20 MG PO TABS: 20.0000 mg | ORAL_TABLET | Freq: Every day | ORAL | Status: DC

## 2015-05-22 NOTE — Assessment & Plan Note (Signed)
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 Return back to February 2015 and started tamoxifen 20 mg daily, BRCA1 and BRCA2 negative  Tamoxifen toxicities:  Breast Cancer Surveillance: 1. Breast exam 05/22/2015: Normal 2. Mammogram August 2015 retropectoral implants bilaterally. Postsurgical changes. Breast Density Category C. I recommended that she get 3-D mammograms for surveillance. Discussed the differences between different breast density categories.  Return to clinic in 1 year for follow-up

## 2015-05-22 NOTE — Telephone Encounter (Signed)
Gave avs & calendar for July 2017

## 2015-05-22 NOTE — Progress Notes (Signed)
Patient Care Team: Harlan Stains, MD as PCP - General (Family Medicine)  DIAGNOSIS: Breast cancer, left breast   Staging form: Breast, AJCC 7th Edition     Clinical: Tis - Signed by Deatra Robinson, MD on 12/02/2013   SUMMARY OF ONCOLOGIC HISTORY:   Breast cancer, left breast   07/25/2009 Initial Diagnosis Breast cancer, left breast   09/25/2009 Surgery Left breast lumpectomy: DCIS low to intermediate grade with necrosis and microcalcifications ER/PR positive   10/22/2009 - 11/21/2009 Radiation Therapy Adjuvant radiation therapy by Dr. Valere Dross, refused antiestrogen therapy    Procedure BRCA1 and BRCA2 negative   12/05/2013 -  Anti-estrogen oral therapy Tamoxifen 20 mg daily    CHIEF COMPLIANT: One-year follow-up on tamoxifen  INTERVAL HISTORY: [name] is a 49 year old with above-mentioned history of left breast DCIS who underwent lumpectomy followed by radiation. She refused initial antiestrogen therapy but then she decided to initiated in 2015. She has had no problems tolerating tamoxifen. She gets occasional hot flashes. Denies any myalgias. In fact amoxicillin had prevented migraines for her. Recently she underwent plastic surgery in her breasts because her implants moved in different directions. She is still sore from the recent surgery and has stitches as well as soreness in the abdomen from removing the fat in the abdomen for the breast reconstruction.  REVIEW OF SYSTEMS:   Constitutional: Denies fevers, chills or abnormal weight loss Eyes: Denies blurriness of vision Ears, nose, mouth, throat, and face: Denies mucositis or sore throat Respiratory: Denies cough, dyspnea or wheezes Cardiovascular: Denies palpitation, chest discomfort or lower extremity swelling Gastrointestinal:  Denies nausea, heartburn or change in bowel habits Skin: Denies abnormal skin rashes Lymphatics: Denies new lymphadenopathy or easy bruising Neurological:Denies numbness, tingling or new  weaknesses Behavioral/Psych: Mood is stable, no new changes  Breast: Sore from recent plastic surgery in the breast. All other systems were reviewed with the patient and are negative.  I have reviewed the past medical history, past surgical history, social history and family history with the patient and they are unchanged from previous note.  ALLERGIES:  has No Known Allergies.  MEDICATIONS:  Current Outpatient Prescriptions  Medication Sig Dispense Refill  . ARMOUR THYROID 90 MG tablet Take 90 mg by mouth daily.     . Cholecalciferol (HM VITAMIN D3) 4000 UNITS CAPS Take 4,000 Units by mouth daily.    . cyclobenzaprine (FLEXERIL) 5 MG tablet Take 5 mg by mouth 3 (three) times daily as needed for muscle spasms.    . fluticasone (FLONASE) 50 MCG/ACT nasal spray Place 1 spray into both nostrils daily as needed.     . methocarbamol (ROBAXIN) 500 MG tablet as needed.    . montelukast (SINGULAIR) 10 MG tablet as needed.    . Omega-3 Fatty Acids (FISH OIL) 1000 MG CAPS Take by mouth daily.    . rizatriptan (MAXALT-MLT) 10 MG disintegrating tablet Take 10 mg by mouth daily as needed for migraine.     . tamoxifen (NOLVADEX) 20 MG tablet Take 1 tablet (20 mg total) by mouth daily. 90 tablet 3   No current facility-administered medications for this visit.    PHYSICAL EXAMINATION: ECOG PERFORMANCE STATUS: 1 - Symptomatic but completely ambulatory  Filed Vitals:   05/22/15 1442  BP: 101/61  Pulse: 70  Temp: 98.1 F (36.7 C)  Resp: 18   Filed Weights   05/22/15 1442  Weight: 110 lb (49.896 kg)    GENERAL:alert, no distress and comfortable SKIN: skin color, texture, turgor are  normal, no rashes or significant lesions EYES: normal, Conjunctiva are pink and non-injected, sclera clear OROPHARYNX:no exudate, no erythema and lips, buccal mucosa, and tongue normal  NECK: supple, thyroid normal size, non-tender, without nodularity LYMPH:  no palpable lymphadenopathy in the cervical, axillary  or inguinal LUNGS: clear to auscultation and percussion with normal breathing effort HEART: regular rate & rhythm and no murmurs and no lower extremity edema ABDOMEN: Not examined  Musculoskeletal:no cyanosis of digits and no clubbing  NEURO: alert & oriented x 3 with fluent speech, no focal motor/sensory deficits  LABORATORY DATA:  I have reviewed the data as listed   Chemistry      Component Value Date/Time   NA 143 05/26/2014 0837   NA 141 04/04/2014 0926   K 4.8 05/26/2014 0837   K 4.5 04/04/2014 0926   CL 104 05/26/2014 0837   CO2 29 05/26/2014 0837   CO2 24 04/04/2014 0926   BUN 16 05/26/2014 0837   BUN 18.9 04/04/2014 0926   CREATININE 0.72 05/26/2014 0837   CREATININE 0.8 04/04/2014 0926      Component Value Date/Time   CALCIUM 8.8 05/26/2014 0837   CALCIUM 8.6 04/04/2014 0926   ALKPHOS 50 04/04/2014 0926   ALKPHOS 31* 10/23/2009 1515   AST 20 04/04/2014 0926   AST 18 10/23/2009 1515   ALT 16 04/04/2014 0926   ALT 15 10/23/2009 1515   BILITOT 0.43 04/04/2014 0926   BILITOT 0.6 10/23/2009 1515       Lab Results  Component Value Date   WBC 3.8* 07/20/2014   HGB 12.2 07/20/2014   HCT 34.7* 07/20/2014   MCV 94.0 07/20/2014   PLT 169 07/20/2014   NEUTROABS 2.3 04/04/2014    ASSESSMENT & PLAN:  Breast cancer, left breast 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 Return back to February 2015 and started tamoxifen 20 mg daily, BRCA1 and BRCA2 negative  Tamoxifen toxicities: Denies any muscle aches or pains does have occasional hot flashes. In fact tamoxifen has stopped her migraines. She would prefer to stay on it even beyond 5 years.  Breast Cancer Surveillance: 1. Breast exam was not done because she just had breast reconstruction surgery 2. Mammogram August 2015 retropectoral  implants bilaterally. Postsurgical changes. Breast Density Category C. I recommended that she get 3-D mammograms for surveillance. Discussed the differences between different breast density categories. Because of her recent surgery on the breasts, mammogram will need to be delayed by several months until it is okay by plastic surgery.  Return to clinic in 1 year for follow-up   No orders of the defined types were placed in this encounter.   The patient has a good understanding of the overall plan. she agrees with it. she will call with any problems that may develop before the next visit here.   Rulon Eisenmenger, MD

## 2015-07-13 ENCOUNTER — Ambulatory Visit (INDEPENDENT_AMBULATORY_CARE_PROVIDER_SITE_OTHER): Payer: 59 | Admitting: Gynecology

## 2015-07-13 ENCOUNTER — Encounter: Payer: Self-pay | Admitting: Gynecology

## 2015-07-13 VITALS — BP 110/70 | Ht 64.0 in | Wt 112.0 lb

## 2015-07-13 DIAGNOSIS — N941 Dyspareunia: Secondary | ICD-10-CM

## 2015-07-13 DIAGNOSIS — N898 Other specified noninflammatory disorders of vagina: Secondary | ICD-10-CM | POA: Diagnosis not present

## 2015-07-13 DIAGNOSIS — IMO0002 Reserved for concepts with insufficient information to code with codable children: Secondary | ICD-10-CM

## 2015-07-13 DIAGNOSIS — N3289 Other specified disorders of bladder: Secondary | ICD-10-CM

## 2015-07-13 DIAGNOSIS — R3989 Other symptoms and signs involving the genitourinary system: Secondary | ICD-10-CM

## 2015-07-13 LAB — URINALYSIS W MICROSCOPIC + REFLEX CULTURE
Bacteria, UA: NONE SEEN [HPF]
Bilirubin Urine: NEGATIVE
CASTS: NONE SEEN [LPF]
Crystals: NONE SEEN [HPF]
Glucose, UA: NEGATIVE
HGB URINE DIPSTICK: NEGATIVE
Ketones, ur: NEGATIVE
LEUKOCYTES UA: NEGATIVE
Nitrite: NEGATIVE
PROTEIN: NEGATIVE
RBC / HPF: NONE SEEN RBC/HPF (ref <=2)
Specific Gravity, Urine: 1.01 (ref 1.001–1.035)
WBC, UA: NONE SEEN WBC/HPF (ref <=5)
YEAST: NONE SEEN [HPF]
pH: 7 (ref 5.0–8.0)

## 2015-07-13 LAB — WET PREP FOR TRICH, YEAST, CLUE
CLUE CELLS WET PREP: NONE SEEN
TRICH WET PREP: NONE SEEN
Yeast Wet Prep HPF POC: NONE SEEN

## 2015-07-13 NOTE — Progress Notes (Addendum)
[name] 08-Jun-1966 [id number]        49 y.o.  Presents complaining of vaginal dryness with pain with intercourse. Feels raw and irritated after having intercourse. No deep dyspareunia. Also feels some pressure in her bladder region. No frequency dysuria urgency or low back pain. No fever or chills. 3 years postmenopausal with no bleeding. Treated for breast cancer currently on tamoxifen.  Past medical history,surgical history, problem list, medications, allergies, family history and social history were all reviewed and documented in the EPIC chart.  Directed ROS with pertinent positives and negatives documented in the history of present illness/assessment and plan.  Exam: Wandra Scot assistant Filed Vitals:   07/13/15 1457  BP: 110/70  Height: 5\' 4"  (1.626 m)  Weight: 112 lb (50.803 kg)   General appearance:  Normal Spine straight without CVA tenderness Abdomen soft nontender without masses guarding rebound Pelvic external BUS vagina with atrophic changes. Cervix normal. Uterus normal size midline mobile nontender. Adnexa without masses or tenderness.  Assessment/Plan:  49 y.o. with atrophic vaginitis accounting for her discomfort with intercourse and vaginal dryness. A wet prep was done and is negative. Reviewed options to include OTC products versus considering vaginal estrogen. The issues/controversy of vaginal estrogen in breast-cancer survivor on tamoxifen with possible absorption of the estrogen with systemic exposure discussed. Recommended OTC moisturizers weekly and trial of oils such as olive oil or coconut oil for intercourse to see if this doesn't help. Urinalysis is negative and I think that her bladder symptoms are also related to lack of estrogen and will see if at least moisturizers doesn't help with that.  She relates that her primary is doing less frequent exams every 3 years. I reviewed with her that she needs to have annual pelvic exams. The Pap smears can be spaced out  but examinations need to occur annually as do her breast exams. Patients, and appointment to see me for complete exam in several months will see how she's doing at that point.    Anastasio Auerbach MD, 3:21 PM 07/13/2015

## 2015-07-13 NOTE — Patient Instructions (Signed)
Try the vaginal moisturizers and the oils as we discussed. Follow up in several months for annual exam.

## 2015-07-13 NOTE — Addendum Note (Signed)
Addended by: Burnett Kanaris on: 07/13/2015 04:29 PM   Modules accepted: Orders

## 2015-08-13 ENCOUNTER — Telehealth: Payer: Self-pay | Admitting: *Deleted

## 2015-08-13 NOTE — Telephone Encounter (Signed)
Pt aware she will check with oncologist and let us know.

## 2015-08-13 NOTE — Telephone Encounter (Signed)
Spoke with Dr. Lindi Adie and he is ok with Dr. Phineas Real prescribing estrogen cream for the patient.

## 2015-08-13 NOTE — Telephone Encounter (Signed)
"  I am having vaginal dryness that is affecting that part of my life.  Dr. Phineas Real will prescribe vaginal estrogen cream if approved by Dr. Lindi Adie.  I take Tamoxifen 20 mg and am will ing to go off this because the dryness is this severe.  Return number [id number]."

## 2015-08-13 NOTE — Telephone Encounter (Signed)
Pt called c/o that vaginal OTC moisturizers dicussed at [facility] on 07/13/15 is not working, pt said you mention vaginal estrogen next? Please advise

## 2015-08-13 NOTE — Telephone Encounter (Signed)
Vaginal estrogen certainly would be one option. As a patient with breast cancer and positive estrogen receptors it is considered a relative contraindication to use any estrogen. I would feel most comfortable if she would talk to her oncologist first to make sure they are okay with this. If they are then we can prescribe it or they could prescribe it. I just want to make sure that I am not missing something that they would feel it would be an absolute contraindication to using estrogen because of her breast cancer.Marland Kitchen

## 2015-08-13 NOTE — Telephone Encounter (Signed)
Received call from patient wanting an ok from Dr. Lindi Huffman for estrogen cream that will be prescribed by Dr. Phineas Real. Advised patient that he is ok with this and Dr. Zelphia Cairo office was notified. She verbalized understanding.

## 2015-08-13 NOTE — Telephone Encounter (Signed)
Beverlee Nims called back from Dr. Lindi Adie (oncologist) office and said MD is fine for patient to start vaginal estrogen. Please advise

## 2015-08-14 NOTE — Telephone Encounter (Signed)
Dr.Fontaine not sure if you saw this see the below,

## 2015-08-15 MED ORDER — ESTRADIOL 10 MCG VA TABS: 1.0000 | ORAL_TABLET | VAGINAL | Status: DC

## 2015-08-15 NOTE — Telephone Encounter (Signed)
I called patient back in reference to her desire to start vaginal estrogen. She does have a history of breast cancer with estrogen positive receptors. Tried OTC products as far as moisturizers, lubricants as well as oil products. Does not feel that this is helping with her vaginal dryness and discomfort. She called her oncologist's office and reports that he feels it is fine for her to start vaginal estrogen. I again reviewed with her that this can be considered controversial among physicians about using estrogen in patients with her history for fear of absorption and stimulating recurrent disease. Patient clearly understands the issues and that there are no large studies to document the safety of this. She is miserable as far as the vaginal dryness and feels his quality of life issue once the gland start this. Vagifem 10 g twice weekly prescribed. Patient will follow up if she has issues after initiating this.

## 2015-09-11 ENCOUNTER — Other Ambulatory Visit: Payer: Self-pay

## 2015-09-11 MED ORDER — ESTRADIOL 10 MCG VA TABS: 1.0000 | ORAL_TABLET | VAGINAL | Status: DC

## 2015-09-14 ENCOUNTER — Encounter: Payer: 59 | Admitting: Gynecology

## 2015-10-03 ENCOUNTER — Encounter: Payer: Self-pay | Admitting: Gynecology

## 2015-10-03 ENCOUNTER — Ambulatory Visit (INDEPENDENT_AMBULATORY_CARE_PROVIDER_SITE_OTHER): Payer: 59 | Admitting: Gynecology

## 2015-10-03 ENCOUNTER — Other Ambulatory Visit (HOSPITAL_COMMUNITY)
Admission: RE | Admit: 2015-10-03 | Discharge: 2015-10-03 | Disposition: A | Discharge status: Home / Self Care | Payer: 59 | Source: Ambulatory Visit | Attending: Gynecology | Admitting: Gynecology

## 2015-10-03 VITALS — BP 124/82 | Ht 64.0 in | Wt 114.0 lb

## 2015-10-03 DIAGNOSIS — Z01419 Encounter for gynecological examination (general) (routine) without abnormal findings: Secondary | ICD-10-CM

## 2015-10-03 DIAGNOSIS — N952 Postmenopausal atrophic vaginitis: Secondary | ICD-10-CM | POA: Diagnosis not present

## 2015-10-03 DIAGNOSIS — N941 Unspecified dyspareunia: Secondary | ICD-10-CM | POA: Diagnosis not present

## 2015-10-03 MED ORDER — ESTRADIOL 10 MCG VA TABS: 1.0000 | ORAL_TABLET | VAGINAL | Status: DC

## 2015-10-03 MED ORDER — DIAZEPAM 5 MG PO TABS: 5.0000 mg | ORAL_TABLET | Freq: Four times a day (QID) | ORAL | Status: DC | PRN

## 2015-10-03 NOTE — Progress Notes (Signed)
[name] 1966/09/10 [id number]        49 y.o.  No obstetric history on file.  Patient's last menstrual period was 09/22/2012. for annual exam.  Several issues noted below.  Past medical history,surgical history, problem list, medications, allergies, family history and social history were all reviewed and documented as reviewed in the EPIC chart.  ROS:  Performed with pertinent positives and negatives included in the history, assessment and plan.   Additional significant findings :  none   Exam: Leanne Lovely Vitals:   10/03/15 1502  BP: 124/82  Height: 5\' 4"  (1.626 m)  Weight: 114 lb (51.71 kg)   General appearance:  Normal affect, orientation and appearance. Skin: Grossly normal HEENT: Without gross lesions.  No cervical or supraclavicular adenopathy. Thyroid normal.  Lungs:  Clear without wheezing, rales or rhonchi Cardiac: RR, without RMG Abdominal:  Soft, nontender, without masses, guarding, rebound, organomegaly or hernia Breasts:  Examined lying and sitting without masses, retractions, discharge or axillary adenopathy.  Bilateral implants noted. Lumpectomy scar on left well-healed. Pelvic:  Ext/BUS/vagina normal with mild atrophic changes  Cervix normal with atrophic changes. Pap smear done  Uterus anteverted, normal size, shape and contour, midline and mobile nontender   Adnexa  Without masses or tenderness    Anus and perineum  Normal   Rectovaginal  Normal sphincter tone without palpated masses or tenderness.    Assessment/Plan:  49 y.o. No obstetric history on file. female for annual exam.   1. Postmenopausal/atrophic vaginitis. Patient was having significant vaginal dryness and dyspareunia. We discussed this previously and ultimately she called back and wanted to start on Vagifem. She does have a history of breast cancer and she discussed this with her oncologist who agreed with her starting the Vagifem. I again reviewed the issues of absorption and  possible stimulation of breast cancer/metastatic disease. She is comfortable continuing this and actually no specific improvement. She still is having some discomfort which sounds almost more vaginismus she says is tight when he tries to enter. They are using lubricant. Valium 5 mg #30 no refill provided. Recommended she try this as a before intercourse to see if this doesn't help until she gets the full benefit of the Vagifem over the next several months. Patient knows to report any vaginal bleeding or other issues. 2. History of left sided breast cancer 2010. Currently on tamoxifen.  Patient is due for mammogram and knows to schedule this.  Exam and NED.  SBE monthly reviewed. 3. Pap smear 2013. Pap smear done today. No history of significant abnormal Pap smears. 4. Colonoscopy. Need to arrange baseline colonoscopy as she has turned 49. 5. DEXA never. We'll plan further into the menopause. 6. Health maintenance. No routine blood work done as patient reports this done through her primary physician's office. Follow up 1 year, sooner as needed.   Anastasio Auerbach MD, 3:25 PM 10/03/2015

## 2015-10-03 NOTE — Addendum Note (Signed)
Addended by: Burnett Kanaris on: 10/03/2015 03:37 PM   Modules accepted: Orders

## 2015-10-03 NOTE — Patient Instructions (Addendum)
Schedule a screening colonoscopy if you have not done so yet.  [name]   Address: [address], [place], [place] [address]  [phone]    or  [facility]  Address: [address], [place], [place] [address]  [phone]      You may obtain a copy of any labs that were done today by logging onto MyChart as outlined in the instructions provided with your AVS (after visit summary). The office will not call with normal lab results but certainly if there are any significant abnormalities then we will contact you.   Health Maintenance Adopting a healthy lifestyle and getting preventive care can go a long way to promote health and wellness. Talk with your health care provider about what schedule of regular examinations is right for you. This is a good chance for you to check in with your provider about disease prevention and staying healthy. In between checkups, there are plenty of things you can do on your own. Experts have done a lot of research about which lifestyle changes and preventive measures are most likely to keep you healthy. Ask your health care provider for more information. WEIGHT AND DIET  Eat a healthy diet  Be sure to include plenty of vegetables, fruits, low-fat dairy products, and lean protein.  Do not eat a lot of foods high in solid fats, added sugars, or salt.  Get regular exercise. This is one of the most important things you can do for your health.  Most adults should exercise for at least 150 minutes each week. The exercise should increase your heart rate and make you sweat (moderate-intensity exercise).  Most adults should also do strengthening exercises at least twice a week. This is in addition to the moderate-intensity exercise.  Maintain a healthy weight  Body mass index (BMI) is a measurement that can be used to identify possible weight problems. It estimates body fat based on height and weight. Your health care  provider can help determine your BMI and help you achieve or maintain a healthy weight.  For females 42 years of age and older:   A BMI below 18.5 is considered underweight.  A BMI of 18.5 to 24.9 is normal.  A BMI of 25 to 29.9 is considered overweight.  A BMI of 30 and above is considered obese.  Watch levels of cholesterol and blood lipids  You should start having your blood tested for lipids and cholesterol at 51 years of age, then have this test every 5 years.  You may need to have your cholesterol levels checked more often if:  Your lipid or cholesterol levels are high.  You are older than 51 years of age.  You are at high risk for heart disease.  CANCER SCREENING   Lung Cancer  Lung cancer screening is recommended for adults 7-36 years old who are at high risk for lung cancer because of a history of smoking.  A yearly low-dose CT scan of the lungs is recommended for people who:  Currently smoke.  Have quit within the past 15 years.  Have at least a 30-pack-year history of smoking. A pack year is smoking an average of one pack of cigarettes a day for 1 year.  Yearly screening should continue until it has been 15 years since you quit.  Yearly screening should stop if you develop a health problem that would prevent you from having lung cancer treatment.  Breast Cancer  Practice breast self-awareness. This means understanding  how your breasts normally appear and feel.  It also means doing regular breast self-exams. Let your health care provider know about any changes, no matter how small.  If you are in your 20s or 30s, you should have a clinical breast exam (CBE) by a health care provider every 1-3 years as part of a regular health exam.  If you are 55 or older, have a CBE every year. Also consider having a breast X-ray (mammogram) every year.  If you have a family history of breast cancer, talk to your health care provider about genetic screening.  If you  are at high risk for breast cancer, talk to your health care provider about having an MRI and a mammogram every year.  Breast cancer gene (BRCA) assessment is recommended for women who have family members with BRCA-related cancers. BRCA-related cancers include:  Breast.  Ovarian.  Tubal.  Peritoneal cancers.  Results of the assessment will determine the need for genetic counseling and BRCA1 and BRCA2 testing. Cervical Cancer Routine pelvic examinations to screen for cervical cancer are no longer recommended for nonpregnant women who are considered low risk for cancer of the pelvic organs (ovaries, uterus, and vagina) and who do not have symptoms. A pelvic examination may be necessary if you have symptoms including those associated with pelvic infections. Ask your health care provider if a screening pelvic exam is right for you.   The Pap test is the screening test for cervical cancer for women who are considered at risk.  If you had a hysterectomy for a problem that was not cancer or a condition that could lead to cancer, then you no longer need Pap tests.  If you are older than 65 years, and you have had normal Pap tests for the past 10 years, you no longer need to have Pap tests.  If you have had past treatment for cervical cancer or a condition that could lead to cancer, you need Pap tests and screening for cancer for at least 20 years after your treatment.  If you no longer get a Pap test, assess your risk factors if they change (such as having a new sexual partner). This can affect whether you should start being screened again.  Some women have medical problems that increase their chance of getting cervical cancer. If this is the case for you, your health care provider may recommend more frequent screening and Pap tests.  The human papillomavirus (HPV) test is another test that may be used for cervical cancer screening. The HPV test looks for the virus that can cause cell changes in  the cervix. The cells collected during the Pap test can be tested for HPV.  The HPV test can be used to screen women 75 years of age and older. Getting tested for HPV can extend the interval between normal Pap tests from three to five years.  An HPV test also should be used to screen women of any age who have unclear Pap test results.  After 51 years of age, women should have HPV testing as often as Pap tests.  Colorectal Cancer  This type of cancer can be detected and often prevented.  Routine colorectal cancer screening usually begins at 51 years of age and continues through 51 years of age.  Your health care provider may recommend screening at an earlier age if you have risk factors for colon cancer.  Your health care provider may also recommend using home test kits to check for hidden blood in  the stool.  A small camera at the end of a tube can be used to examine your colon directly (sigmoidoscopy or colonoscopy). This is done to check for the earliest forms of colorectal cancer.  Routine screening usually begins at age 30.  Direct examination of the colon should be repeated every 5-10 years through 51 years of age. However, you may need to be screened more often if early forms of precancerous polyps or small growths are found. Skin Cancer  Check your skin from head to toe regularly.  Tell your health care provider about any new moles or changes in moles, especially if there is a change in a mole's Huffman or color.  Also tell your health care provider if you have a mole that is larger than the size of a pencil eraser.  Always use sunscreen. Apply sunscreen liberally and repeatedly throughout the day.  Protect yourself by wearing long sleeves, pants, a wide-brimmed hat, and sunglasses whenever you are outside. HEART DISEASE, DIABETES, AND HIGH BLOOD PRESSURE   Have your blood pressure checked at least every 1-2 years. High blood pressure causes heart disease and increases the  risk of stroke.  If you are between 97 years and 13 years old, ask your health care provider if you should take aspirin to prevent strokes.  Have regular diabetes screenings. This involves taking a blood sample to check your fasting blood sugar level.  If you are at a normal weight and have a low risk for diabetes, have this test once every three years after 51 years of age.  If you are overweight and have a high risk for diabetes, consider being tested at a younger age or more often. PREVENTING INFECTION  Hepatitis B  If you have a higher risk for hepatitis B, you should be screened for this virus. You are considered at high risk for hepatitis B if:  You were born in a country where hepatitis B is common. Ask your health care provider which countries are considered high risk.  Your parents were born in a high-risk country, and you have not been immunized against hepatitis B (hepatitis B vaccine).  You have HIV or AIDS.  You use needles to inject street drugs.  You live with someone who has hepatitis B.  You have had sex with someone who has hepatitis B.  You get hemodialysis treatment.  You take certain medicines for conditions, including cancer, organ transplantation, and autoimmune conditions. Hepatitis C  Blood testing is recommended for:  Everyone born from [birth year] through 1967.  Anyone with known risk factors for hepatitis C. Sexually transmitted infections (STIs)  You should be screened for sexually transmitted infections (STIs) including gonorrhea and chlamydia if:  You are sexually active and are younger than 51 years of age.  You are older than 51 years of age and your health care provider tells you that you are at risk for this type of infection.  Your sexual activity has changed since you were last screened and you are at an increased risk for chlamydia or gonorrhea. Ask your health care provider if you are at risk.  If you do not have HIV, but are at risk, it  may be recommended that you take a prescription medicine daily to prevent HIV infection. This is called pre-exposure prophylaxis (PrEP). You are considered at risk if:  You are sexually active and do not regularly use condoms or know the HIV status of your partner(s).  You take drugs by injection.  You  are sexually active with a partner who has HIV. Talk with your health care provider about whether you are at high risk of being infected with HIV. If you choose to begin PrEP, you should first be tested for HIV. You should then be tested every 3 months for as long as you are taking PrEP.  PREGNANCY   If you are premenopausal and you may become pregnant, ask your health care provider about preconception counseling.  If you may become pregnant, take 400 to 800 micrograms (mcg) of folic acid every day.  If you want to prevent pregnancy, talk to your health care provider about birth control (contraception). OSTEOPOROSIS AND MENOPAUSE   Osteoporosis is a disease in which the bones lose minerals and strength with aging. This can result in serious bone fractures. Your risk for osteoporosis can be identified using a bone density scan.  If you are 69 years of age or older, or if you are at risk for osteoporosis and fractures, ask your health care provider if you should be screened.  Ask your health care provider whether you should take a calcium or vitamin D supplement to lower your risk for osteoporosis.  Menopause may have certain physical symptoms and risks.  Hormone replacement therapy may reduce some of these symptoms and risks. Talk to your health care provider about whether hormone replacement therapy is right for you.  HOME CARE INSTRUCTIONS   Schedule regular health, dental, and eye exams.  Stay current with your immunizations.   Do not use any tobacco products including cigarettes, chewing tobacco, or electronic cigarettes.  If you are pregnant, do not drink alcohol.  If you are  breastfeeding, limit how much and how often you drink alcohol.  Limit alcohol intake to no more than 1 drink per day for nonpregnant women. One drink equals 12 ounces of beer, 5 ounces of wine, or 1 ounces of hard liquor.  Do not use street drugs.  Do not share needles.  Ask your health care provider for help if you need support or information about quitting drugs.  Tell your health care provider if you often feel depressed.  Tell your health care provider if you have ever been abused or do not feel safe at home. Document Released: 05/05/2011 Document Revised: 03/06/2014 Document Reviewed: 09/21/2013 [facility] Patient Information 2015 [place], [place]. This information is not intended to replace advice given to you by your health care provider. Make sure you discuss any questions you have with your health care provider.

## 2015-10-05 LAB — CYTOLOGY - PAP

## 2015-11-09 ENCOUNTER — Other Ambulatory Visit: Payer: Self-pay | Admitting: Gynecology

## 2015-11-20 ENCOUNTER — Other Ambulatory Visit: Payer: Self-pay | Admitting: *Deleted

## 2015-11-20 DIAGNOSIS — D0510 Intraductal carcinoma in situ of unspecified breast: Secondary | ICD-10-CM

## 2015-11-20 MED ORDER — TAMOXIFEN CITRATE 20 MG PO TABS: 20.0000 mg | ORAL_TABLET | Freq: Every day | ORAL | Status: DC

## 2016-03-25 ENCOUNTER — Other Ambulatory Visit: Payer: Self-pay | Admitting: Gynecology

## 2016-03-25 DIAGNOSIS — Z853 Personal history of malignant neoplasm of breast: Secondary | ICD-10-CM

## 2016-04-01 ENCOUNTER — Ambulatory Visit
Admission: RE | Admit: 2016-04-01 | Discharge: 2016-04-01 | Disposition: A | Discharge status: Home / Self Care | Payer: BLUE CROSS/BLUE SHIELD | Source: Ambulatory Visit | Attending: Gynecology | Admitting: Gynecology

## 2016-04-01 DIAGNOSIS — Z853 Personal history of malignant neoplasm of breast: Secondary | ICD-10-CM

## 2016-05-15 ENCOUNTER — Other Ambulatory Visit: Payer: Self-pay

## 2016-05-20 ENCOUNTER — Ambulatory Visit: Payer: 59 | Admitting: Hematology and Oncology

## 2016-05-20 NOTE — Assessment & Plan Note (Deleted)
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 Return back to February 2015 and started tamoxifen 20 mg daily, BRCA1 and BRCA2 negative  Tamoxifen toxicities: Denies any muscle aches or pains does have occasional hot flashes. In fact tamoxifen has stopped her migraines. She would prefer to stay on it even beyond 5 years.  Breast Cancer Surveillance: 1. Breast exam 05/20/2016: Bilateral breast reconstructions, benign 2. Mammogram 04/01/2016: Postlumpectomy changes no suspicious masses. Breast Density Category C. I recommended that she get 3-D mammograms for surveillance.  Return to clinic in 1 year for follow-up

## 2016-05-29 ENCOUNTER — Ambulatory Visit (INDEPENDENT_AMBULATORY_CARE_PROVIDER_SITE_OTHER): Payer: BLUE CROSS/BLUE SHIELD | Admitting: Gynecology

## 2016-05-29 ENCOUNTER — Encounter: Payer: Self-pay | Admitting: Gynecology

## 2016-05-29 VITALS — BP 116/76

## 2016-05-29 DIAGNOSIS — IMO0001 Reserved for inherently not codable concepts without codable children: Secondary | ICD-10-CM

## 2016-05-29 DIAGNOSIS — F526 Dyspareunia not due to a substance or known physiological condition: Secondary | ICD-10-CM

## 2016-05-29 DIAGNOSIS — N952 Postmenopausal atrophic vaginitis: Secondary | ICD-10-CM | POA: Diagnosis not present

## 2016-05-29 MED ORDER — ESTRADIOL 10 MCG VA TABS: 1.0000 | ORAL_TABLET | VAGINAL | 2 refills | Status: DC

## 2016-05-29 MED ORDER — ESTRADIOL 10 MCG VA TABS: 1.0000 | ORAL_TABLET | VAGINAL | 3 refills | Status: DC

## 2016-05-29 NOTE — Progress Notes (Signed)
    [name] 07-Dec-1965 [id number]        49 y.o.  presents in follow up of dyspareunia. 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 We ultimately discussed the use of vaginal estrogen recognizing that she is a breast cancer survivor and had a detailed discussion about potential risks of absorption and stimulation of breast cancer with increased recurrence risk. She ultimately started Vagifem twice weekly after approval from her oncologist did this for several months and noted that her vaginal discomfort improved but then stopped it back in March. Notes that the discomfort is primarily on the right vaginal sidewall.  Past medical history,surgical history, problem list, medications, allergies, family history and social history were all reviewed and documented in the EPIC chart.  Directed ROS with pertinent positives and negatives documented in the history of present illness/assessment and plan.  Exam: Caryn Bee assistant Vitals:   05/29/16 1207  BP: 116/76   General appearance:  Normal Abdomen soft nontender without masses guarding rebound Pelvic external BUS vagina with atrophic changes. Does easily admit 2 fingers without visual or  palpable abnormalities along vaginal mucosa. Cervix atrophic. Uterus normal size midline mobile nontender. Adnexa without masses or tenderness.  Assessment/Plan:  50 y.o. with atrophic vaginitis and significant dyspareunia prohibiting intercourse. Had been on Vagifem which improved her situation but she stopped it thinking that she was only to use it for several months. I again reviewed in detail the whole issue of using any hormonal products in a breast cancer survivor. Possible stimulation of recurrence reviewed. Patient's comfortable with reinitiating the Vagifem accepting the risks. Refill through next annual exam provided and she'll go and start that now. She'll follow up if this continues to be a big issue. She is seeing her oncologist  tomorrow and will also sure that they know that she is starting it and get their approval.  Greater than 50% of my time was spent in direct face to face counseling and coordination of care with the patient.   Anastasio Auerbach MD, 12:23 PM 05/29/2016

## 2016-05-29 NOTE — Patient Instructions (Signed)
Start back on the vaginal estrogen tablets twice weekly. Make sure you lecture oncologist know the started this and that they're okay with this. Follow up if the issues continue.

## 2016-05-30 ENCOUNTER — Encounter: Payer: Self-pay | Admitting: Hematology and Oncology

## 2016-05-30 ENCOUNTER — Telehealth: Payer: Self-pay | Admitting: Hematology and Oncology

## 2016-05-30 ENCOUNTER — Ambulatory Visit (HOSPITAL_BASED_OUTPATIENT_CLINIC_OR_DEPARTMENT_OTHER): Payer: BLUE CROSS/BLUE SHIELD | Admitting: Hematology and Oncology

## 2016-05-30 DIAGNOSIS — N898 Other specified noninflammatory disorders of vagina: Secondary | ICD-10-CM | POA: Diagnosis not present

## 2016-05-30 DIAGNOSIS — C50212 Malignant neoplasm of upper-inner quadrant of left female breast: Secondary | ICD-10-CM

## 2016-05-30 DIAGNOSIS — Z170 Estrogen receptor positive status [ER+]: Secondary | ICD-10-CM

## 2016-05-30 DIAGNOSIS — N951 Menopausal and female climacteric states: Secondary | ICD-10-CM | POA: Diagnosis not present

## 2016-05-30 NOTE — Telephone Encounter (Signed)
Pt will call office to schedule one year follow up appt

## 2016-05-30 NOTE — Progress Notes (Signed)
Patient Care Team: Harlan Stains, MD as PCP - General (Family Medicine) Anastasio Auerbach, MD as Consulting Physician (Gynecology)  DIAGNOSIS: Breast cancer of upper-inner quadrant of left female breast [facility])   Staging form: Breast, AJCC 7th Edition   - Clinical: [place] - Signed by Deatra Robinson, MD on 12/02/2013  SUMMARY OF ONCOLOGIC HISTORY:   Breast cancer of upper-inner quadrant of left female breast ([place])   07/25/2009 Initial Diagnosis    Breast cancer, left breast     09/25/2009 Surgery    Left breast lumpectomy: DCIS low to intermediate grade with necrosis and microcalcifications ER/PR positive     10/22/2009 - 11/21/2009 Radiation Therapy    Adjuvant radiation therapy by Dr. Valere Dross, refused antiestrogen therapy      Procedure    BRCA1 and BRCA2 negative     12/05/2013 - 05/30/2016 Anti-estrogen oral therapy    Tamoxifen 20 mg daily, stopped due to hot flashes, vaginal dryness      CHIEF COMPLIANT: Follow-up on tamoxifen therapy  INTERVAL HISTORY: [name] is a 50 year old with above-mentioned history of left breast cancer treated with lumpectomy and radiation is currently on oral antiestrogen therapy with tamoxifen. Even though her DCIS was diagnosed in 2010, she did not go on antiestrogen therapy initially and was started on it in February 2015. She is experiencing more profound hot flashes which have been quite severe as well as perform vaginal dryness. She was initially taking vaginal estrogen tablets which have helped her. But when she stopped advised dryness appears to be getting worse. Tamoxifen has helped her migraines. She is hoping to discontinue tamoxifen therapy.  REVIEW OF SYSTEMS:   Constitutional: Denies fevers, chills or abnormal weight loss Eyes: Denies blurriness of vision Ears, nose, mouth, throat, and face: Denies mucositis or sore throat Respiratory: Denies cough, dyspnea or wheezes Cardiovascular: Denies palpitation, chest  discomfort Gastrointestinal:  Denies nausea, heartburn or change in bowel habits Skin: Denies abnormal skin rashes Lymphatics: Denies new lymphadenopathy or easy bruising Neurological:Denies numbness, tingling or new weaknesses Behavioral/Psych: Mood is stable, no new changes  Extremities: No lower extremity edema Breast:  denies any pain or lumps or nodules in either breasts All other systems were reviewed with the patient and are negative.  I have reviewed the past medical history, past surgical history, social history and family history with the patient and they are unchanged from previous note.  ALLERGIES:  has No Known Allergies.  MEDICATIONS:  Current Outpatient Prescriptions  Medication Sig Dispense Refill  . ARMOUR THYROID 90 MG tablet Take 90 mg by mouth daily.     . Cholecalciferol (HM VITAMIN D3) 4000 UNITS CAPS Take 4,000 Units by mouth daily.    . cyclobenzaprine (FLEXERIL) 5 MG tablet Take 5 mg by mouth 3 (three) times daily as needed for muscle spasms.    . diazepam (VALIUM) 5 MG tablet Take 1 tablet (5 mg total) by mouth every 6 (six) hours as needed for anxiety. (Patient not taking: Reported on 05/29/2016) 30 tablet 0  . Estradiol 10 MCG TABS vaginal tablet Place 1 tablet (10 mcg total) vaginally 2 (two) times a week. 24 tablet 3  . fluticasone (FLONASE) 50 MCG/ACT nasal spray Place 1 spray into both nostrils daily as needed.     . Omega-3 Fatty Acids (FISH OIL) 1000 MG CAPS Take by mouth daily.    . rizatriptan (MAXALT-MLT) 10 MG disintegrating tablet Take 10 mg by mouth daily as needed for migraine.     Marland Kitchen  tamoxifen (NOLVADEX) 20 MG tablet Take 1 tablet (20 mg total) by mouth daily. 90 tablet 3   No current facility-administered medications for this visit.     PHYSICAL EXAMINATION: ECOG PERFORMANCE STATUS: 0 - Asymptomatic  Vitals:   05/30/16 0839  BP: 104/72  Pulse: 67  Resp: 18  Temp: 98.4 F (36.9 C)   Filed Weights   05/30/16 0839  Weight: 118 lb (53.5  kg)    GENERAL:alert, no distress and comfortable SKIN: skin color, texture, turgor are normal, no rashes or significant lesions EYES: normal, Conjunctiva are pink and non-injected, sclera clear OROPHARYNX:no exudate, no erythema and lips, buccal mucosa, and tongue normal  NECK: supple, thyroid normal size, non-tender, without nodularity LYMPH:  no palpable lymphadenopathy in the cervical, axillary or inguinal LUNGS: clear to auscultation and percussion with normal breathing effort HEART: regular rate & rhythm and no murmurs and no lower extremity edema ABDOMEN:abdomen soft, non-tender and normal bowel sounds MUSCULOSKELETAL:no cyanosis of digits and no clubbing  NEURO: alert & oriented x 3 with fluent speech, no focal motor/sensory deficits EXTREMITIES: No lower extremity edema BREAST: No palpable masses or nodules in either right or left breasts. No palpable axillary supraclavicular or infraclavicular adenopathy no breast tenderness or nipple discharge. (exam performed in the presence of a chaperone)  LABORATORY DATA:  I have reviewed the data as listed   Chemistry      Component Value Date/Time   NA 141 05/22/2015 1431   K 4.3 05/22/2015 1431   CL 104 05/26/2014 0837   CO2 30 (H) 05/22/2015 1431   BUN 15.9 05/22/2015 1431   CREATININE 0.8 05/22/2015 1431      Component Value Date/Time   CALCIUM 9.0 05/22/2015 1431   ALKPHOS 48 05/22/2015 1431   AST 17 05/22/2015 1431   ALT 12 05/22/2015 1431   BILITOT 0.26 05/22/2015 1431       Lab Results  Component Value Date   WBC 4.3 05/22/2015   HGB 11.8 05/22/2015   HCT 34.3 (L) 05/22/2015   MCV 95.4 05/22/2015   PLT 155 05/22/2015   NEUTROABS 2.4 05/22/2015     ASSESSMENT & PLAN:  Breast cancer of upper-inner quadrant of left female breast ([place]) 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 Return back to February 2015 and started tamoxifen 20 mg daily, BRCA1 and BRCA2 negative  Tamoxifen toxicities:  1. Severe hot flashes 2. Vaginal dryness   because of these issues patient wishes to discontinue tamoxifen therapy.  Breast Cancer Surveillance: 1. Breast exam 05/30/2016: Bilateral breast reconstructions, benign 2. Mammogram 04/01/2016: Postlumpectomy changes no suspicious masses. Breast Density Category C. I recommended that she get 3-D mammograms for surveillance.  Return to clinic in 1 year for follow-up in Survivorship clinic    No orders of the defined types were placed in this encounter.  The patient has a good understanding of the overall plan. she agrees with it. she will call with any problems that may develop before the next visit here.   Rulon Eisenmenger, MD 05/30/16

## 2016-05-30 NOTE — Assessment & Plan Note (Signed)
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 Return back to February 2015 and started tamoxifen 20 mg daily, BRCA1 and BRCA2 negative  Tamoxifen toxicities: Denies any muscle aches or pains does have occasional hot flashes. In fact tamoxifen has stopped her migraines. She would prefer to stay on it even beyond 5 years.  Breast Cancer Surveillance: 1. Breast exam 05/30/2016: Bilateral breast reconstructions, benign 2. Mammogram 04/01/2016: Postlumpectomy changes no suspicious masses. Breast Density Category C. I recommended that she get 3-D mammograms for surveillance.  Return to clinic in 1 year for follow-up

## 2016-05-30 NOTE — Telephone Encounter (Signed)
appt made and avs printed °

## 2016-06-24 ENCOUNTER — Telehealth: Payer: Self-pay | Admitting: Adult Health

## 2016-06-24 NOTE — Telephone Encounter (Signed)
05/29/2017 Appointment canceled per patient request. Patient does not wish to reschedule appointment.

## 2016-10-16 ENCOUNTER — Encounter: Payer: BLUE CROSS/BLUE SHIELD | Admitting: Gynecology

## 2016-11-20 ENCOUNTER — Encounter: Payer: BLUE CROSS/BLUE SHIELD | Admitting: Gynecology

## 2016-12-01 ENCOUNTER — Encounter: Payer: BLUE CROSS/BLUE SHIELD | Admitting: Gynecology

## 2016-12-12 ENCOUNTER — Encounter: Payer: BLUE CROSS/BLUE SHIELD | Admitting: Gynecology

## 2016-12-16 ENCOUNTER — Ambulatory Visit (INDEPENDENT_AMBULATORY_CARE_PROVIDER_SITE_OTHER): Payer: BLUE CROSS/BLUE SHIELD | Admitting: Gynecology

## 2016-12-16 ENCOUNTER — Encounter: Payer: Self-pay | Admitting: Gynecology

## 2016-12-16 VITALS — BP 114/70 | Ht 64.0 in | Wt 119.0 lb

## 2016-12-16 DIAGNOSIS — C50912 Malignant neoplasm of unspecified site of left female breast: Secondary | ICD-10-CM | POA: Diagnosis not present

## 2016-12-16 DIAGNOSIS — N952 Postmenopausal atrophic vaginitis: Secondary | ICD-10-CM

## 2016-12-16 DIAGNOSIS — Z01411 Encounter for gynecological examination (general) (routine) with abnormal findings: Secondary | ICD-10-CM | POA: Diagnosis not present

## 2016-12-16 NOTE — Progress Notes (Signed)
    [name] 04/25/1966 [id number]        51 y.o.  G2P2 for annual exam.  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 Had used Vagifem for several months but did not seem to have any benefits. Stopped it several weeks ago.  Past medical history,surgical history, problem list, medications, allergies, family history and social history were all reviewed and documented as reviewed in the EPIC chart.  ROS:  Performed with pertinent positives and negatives included in the history, assessment and plan.   Additional significant findings :  None   Exam: [name] assistant Vitals:   12/16/16 0911  BP: 114/70  Weight: 119 lb (54 kg)  Height: 5\' 4"  (1.626 m)   Body mass index is 20.43 kg/m.  General appearance:  Normal affect, orientation and appearance. Skin: Grossly normal HEENT: Without gross lesions.  No cervical or supraclavicular adenopathy. Thyroid normal.  Lungs:  Clear without wheezing, rales or rhonchi Cardiac: RR, without RMG Abdominal:  Soft, nontender, without masses, guarding, rebound, organomegaly or hernia Breasts:  Examined lying and sitting without masses, retractions, discharge or axillary adenopathy.  Bilateral implants noted. Well-healed left lumpectomy scar Pelvic:  Ext, BUS, Vagina with atrophic changes  Cervix with atrophic changes  Uterus anteverted, normal size, shape and contour, midline and mobile nontender   Adnexa without masses or tenderness    Anus and perineum normal   Rectovaginal normal sphincter tone without palpated masses or tenderness.    Assessment/Plan:  51 y.o. G2P2 female for annual exam.   1. Postmenopausal/atrophic genital changes. Continues to have dyspareunia. Is not having vaginal dryness in between. Had used Vagifem twice weekly without success. Is using oil-based lubricants without success. Patient had questions about [name].  I reviewed with her that we do not perform this. I discussed in  general the procedure and that it is new without long-term data as far as long-term efficacy, requirements for retreatment and long-term safety. Discussed ACOG's statement as far as no long-term data.  Patients going to decide if she wants to discuss this with a group who does the procedure and she'll make an appointment to do so she wants to pursue this. I did review trial of different vaginal estrogen such as estrogen cream twice weekly up to 3 times weekly if needed. At this point she's not interested in doing so. We did review again the issues of estrogen cream possible absorption in her history of breast cancer. She'll follow up with me if she wants to pursue this. 2. Mammography coming due in May/June and I reminded her to schedule this. Continues on tamoxifen. Continues to follow up with her oncologist in reference to her breast cancer management. Exam today NED. 3. Pap smear 09/2015. No Pap smear done today. No history of significant abnormal Pap smears previously. Plan repeat Pap smear at 3 year interval. 4. Colonoscopy 2016. Repeat at their recommended interval. 5. DEXA never. Will plan further into the menopause. 6. Health maintenance. No routine lab work done as patient does this elsewhere. Follow up 1 year, sooner as needed.  Jamie Auerbach MD, 9:45 AM 12/16/2016

## 2016-12-16 NOTE — Patient Instructions (Signed)

## 2016-12-24 ENCOUNTER — Other Ambulatory Visit: Payer: Self-pay

## 2016-12-24 ENCOUNTER — Telehealth: Payer: Self-pay

## 2016-12-24 NOTE — Telephone Encounter (Signed)
Called pt to verify tamoxifen medication and pharmacy. Pt states that she needs to see Dr.Gudena because pt had to stop tamoxifen and use half a pill a few months ago when pt started using vaginal estrogen. Pt states that she was on vaginal estrogen for 3 months and is no longer taking it currently. Pt has since resumed 1 tablet of tamoxifen daily and has been feeling better. PT states that she feels a lump under her breast , but not sure what it is. Pt just noticed this lump and would like to discuss this further with Dr.Gudena before proceeding to any further testing. Told pt that we may need to do some diagnostic mammogram to make sure nothing is wrong. Told pt that I will get her in to see Dr.Gudena on Friday 2/23 to discuss this further. Pt verbalized understanding and confirmed appt.

## 2016-12-26 ENCOUNTER — Encounter: Payer: Self-pay | Admitting: Hematology and Oncology

## 2016-12-26 ENCOUNTER — Ambulatory Visit (HOSPITAL_BASED_OUTPATIENT_CLINIC_OR_DEPARTMENT_OTHER): Payer: BLUE CROSS/BLUE SHIELD | Admitting: Hematology and Oncology

## 2016-12-26 DIAGNOSIS — N898 Other specified noninflammatory disorders of vagina: Secondary | ICD-10-CM | POA: Diagnosis not present

## 2016-12-26 DIAGNOSIS — N951 Menopausal and female climacteric states: Secondary | ICD-10-CM | POA: Diagnosis not present

## 2016-12-26 DIAGNOSIS — Z79810 Long term (current) use of selective estrogen receptor modulators (SERMs): Secondary | ICD-10-CM | POA: Diagnosis not present

## 2016-12-26 DIAGNOSIS — C50212 Malignant neoplasm of upper-inner quadrant of left female breast: Secondary | ICD-10-CM

## 2016-12-26 DIAGNOSIS — D0512 Intraductal carcinoma in situ of left breast: Secondary | ICD-10-CM | POA: Diagnosis not present

## 2016-12-26 DIAGNOSIS — Z170 Estrogen receptor positive status [ER+]: Secondary | ICD-10-CM | POA: Diagnosis not present

## 2016-12-26 DIAGNOSIS — R52 Pain, unspecified: Secondary | ICD-10-CM | POA: Diagnosis not present

## 2016-12-26 MED ORDER — TAMOXIFEN CITRATE 20 MG PO TABS: 20.0000 mg | ORAL_TABLET | Freq: Every day | ORAL | 3 refills | Status: DC

## 2016-12-26 NOTE — Assessment & Plan Note (Signed)
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 Return back to February 2015 and started tamoxifen 20 mg daily, BRCA1 and BRCA2 negative  Tamoxifen toxicities:  1. Severe hot flashes 2. Vaginal dryness  In spite of these symptoms, she is willing to take the tamoxifen.  Lump underneath the breast:  Breast Cancer Surveillance: 1. Breast exam 05/30/2016: Bilateral breast reconstructions, benign 2. Mammogram 04/01/2016: Postlumpectomy changes no suspicious masses. Breast Density Category C. I recommended that she get 3-D mammograms for surveillance.  Return to clinic in 1 year for follow-up

## 2016-12-26 NOTE — Progress Notes (Signed)
Patient Care Team: Jamie Stains, MD as PCP - General (Family Medicine) Jamie Auerbach, MD as Consulting Physician (Gynecology)  DIAGNOSIS:  Encounter Diagnosis  Name Primary?  . Malignant neoplasm of upper-inner quadrant of left breast in female, estrogen receptor positive ([name])     SUMMARY OF ONCOLOGIC HISTORY:   Breast cancer of upper-inner quadrant of left female breast ([name])   07/25/2009 Initial Diagnosis    Breast cancer, left breast      09/25/2009 Surgery    Left breast lumpectomy: DCIS low to intermediate grade with necrosis and microcalcifications ER/PR positive      10/22/2009 - 11/21/2009 Radiation Therapy    Adjuvant radiation therapy by Dr. Valere Huffman, refused antiestrogen therapy       Procedure    BRCA1 and BRCA2 negative      12/05/2013 -  Anti-estrogen oral therapy    Tamoxifen 20 mg daily       CHIEF COMPLIANT: Patient resumed tamoxifen therapy  INTERVAL HISTORY: [name] is a 51 year old lady with DCIS left breast 2 underwent lumpectomy and radiation and initially started tamoxifen but because of vaginal dryness she discontinued it. Recently she felt scared because she felt an abnormality in the left axilla which went away but this was enough of a scared that she wanted to go back on tamoxifen and she started back on half a tablet and then recently changed to full tablet daily. So far she appears with tolerating it fairly well. She continues to have issues with vaginal dryness.  REVIEW OF SYSTEMS:   Constitutional: Denies fevers, chills or abnormal weight loss Eyes: Denies blurriness of vision Ears, nose, mouth, throat, and face: Denies mucositis or sore throat Respiratory: Denies cough, dyspnea or wheezes Cardiovascular: Denies palpitation, chest discomfort Gastrointestinal:  Denies nausea, heartburn or change in bowel habits Skin: Denies abnormal skin rashes Lymphatics: Denies new lymphadenopathy or easy bruising Neurological:Denies  numbness, tingling or new weaknesses Behavioral/Psych: Mood is stable, no new changes  Extremities: No lower extremity edema Breast: Discomfort in the left axilla All other systems were reviewed with the patient and are negative.  I have reviewed the past medical history, past surgical history, social history and family history with the patient and they are unchanged from previous note.  ALLERGIES:  has No Known Allergies.  MEDICATIONS:  Current Outpatient Prescriptions  Medication Sig Dispense Refill  . ARMOUR THYROID 90 MG tablet Take 90 mg by mouth daily.     . Cholecalciferol (HM VITAMIN D3) 4000 UNITS CAPS Take 4,000 Units by mouth daily.    . cyclobenzaprine (FLEXERIL) 5 MG tablet Take 5 mg by mouth 3 (three) times daily as needed for muscle spasms.    . fluticasone (FLONASE) 50 MCG/ACT nasal spray Place 1 spray into both nostrils daily as needed.     . Omega-3 Fatty Acids (FISH OIL) 1000 MG CAPS Take by mouth daily.    . rizatriptan (MAXALT-MLT) 10 MG disintegrating tablet Take 10 mg by mouth daily as needed for migraine.     . tamoxifen (NOLVADEX) 20 MG tablet Take 1 tablet (20 mg total) by mouth daily. 90 tablet 3   No current facility-administered medications for this visit.     PHYSICAL EXAMINATION: ECOG PERFORMANCE STATUS: 1 - Symptomatic but completely ambulatory  Vitals:   12/26/16 1015  BP: 124/69  Pulse: 66  Resp: 16  Temp: 97.8 F (36.6 C)   Filed Weights   12/26/16 1015  Weight: 115 lb 11.2 oz (52.5 kg)  GENERAL:alert, no distress and comfortable SKIN: skin color, texture, turgor are normal, no rashes or significant lesions EYES: normal, Conjunctiva are pink and non-injected, sclera clear OROPHARYNX:no exudate, no erythema and lips, buccal mucosa, and tongue normal  NECK: supple, thyroid normal size, non-tender, without nodularity LYMPH:  no palpable lymphadenopathy in the cervical, axillary or inguinal LUNGS: clear to auscultation and percussion with  normal breathing effort HEART: regular rate & rhythm and no murmurs and no lower extremity edema ABDOMEN:abdomen soft, non-tender and normal bowel sounds MUSCULOSKELETAL:no cyanosis of digits and no clubbing  NEURO: alert & oriented x 3 with fluent speech, no focal motor/sensory deficits EXTREMITIES: No lower extremity edema BREAST No palpable masses or nodules in either right or left breasts. No palpable axillary supraclavicular or infraclavicular adenopathy no breast tenderness or nipple discharge. (exam performed in the presence of a chaperone)  LABORATORY DATA:  I have reviewed the data as listed   Chemistry      Component Value Date/Time   NA 141 05/22/2015 1431   K 4.3 05/22/2015 1431   CL 104 05/26/2014 0837   CO2 30 (H) 05/22/2015 1431   BUN 15.9 05/22/2015 1431   CREATININE 0.8 05/22/2015 1431      Component Value Date/Time   CALCIUM 9.0 05/22/2015 1431   ALKPHOS 48 05/22/2015 1431   AST 17 05/22/2015 1431   ALT 12 05/22/2015 1431   BILITOT 0.26 05/22/2015 1431       Lab Results  Component Value Date   WBC 4.3 05/22/2015   HGB 11.8 05/22/2015   HCT 34.3 (L) 05/22/2015   MCV 95.4 05/22/2015   PLT 155 05/22/2015   NEUTROABS 2.4 05/22/2015    ASSESSMENT & PLAN:  Breast cancer of upper-inner quadrant of left female breast ([name]) 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 Return back to February 2015 and started tamoxifen 20 mg daily, BRCA1 and BRCA2 negative  Tamoxifen toxicities:  1. Severe hot flashes: Improved 2. Vaginal dryness : Patient was provided information regarding [name] laser therapy. She is now willing to take the tamoxifen.  Pain underneath the breast:Examination does not reveal any palpable lumps or nodules. Patient will have mammogram in July 2018. She also had a  checkup with [name] and did not show any concerns.   Breast Cancer Surveillance: 1. Breast exam 12/26/2016: Bilateral breast reconstructions, benign 2. Mammogram 04/01/2016: Postlumpectomy changes no suspicious masses. Breast Density Category C. I recommended that she get 3-D mammograms for surveillance.  Return to clinic in 1 year for follow-up   I spent 25 minutes talking to the patient of which more than half was spent in counseling and coordination of care.  No orders of the defined types were placed in this encounter.  The patient has a good understanding of the overall plan. she agrees with it. she will call with any problems that may develop before the next visit here.   Rulon Eisenmenger, MD 12/26/16

## 2017-01-25 ENCOUNTER — Encounter: Payer: Self-pay | Admitting: Gynecology

## 2017-01-26 NOTE — Telephone Encounter (Signed)
Office visit for exam and to discuss

## 2017-02-09 ENCOUNTER — Encounter: Payer: Self-pay | Admitting: Gynecology

## 2017-02-09 ENCOUNTER — Ambulatory Visit (INDEPENDENT_AMBULATORY_CARE_PROVIDER_SITE_OTHER): Payer: BLUE CROSS/BLUE SHIELD | Admitting: Gynecology

## 2017-02-09 VITALS — BP 114/66

## 2017-02-09 DIAGNOSIS — R102 Pelvic and perineal pain: Secondary | ICD-10-CM

## 2017-02-09 LAB — URINALYSIS W MICROSCOPIC + REFLEX CULTURE
BACTERIA UA: NONE SEEN [HPF]
BILIRUBIN URINE: NEGATIVE
CRYSTALS: NONE SEEN [HPF]
Casts: NONE SEEN [LPF]
Glucose, UA: NEGATIVE
HGB URINE DIPSTICK: NEGATIVE
KETONES UR: NEGATIVE
Leukocytes, UA: NEGATIVE
Nitrite: NEGATIVE
PROTEIN: NEGATIVE
RBC / HPF: NONE SEEN RBC/HPF (ref <=2)
Specific Gravity, Urine: 1.01 (ref 1.001–1.035)
WBC UA: NONE SEEN WBC/HPF (ref <=5)
Yeast: NONE SEEN [HPF]
pH: 7.5 (ref 5.0–8.0)

## 2017-02-09 NOTE — Progress Notes (Signed)
    [name] [birth year] [id number]        51 y.o.  G2P2 presents complaining of years worth of left-sided back pain. Seems to be getting worse over the last year. Wakes her up at night particularly. Less prominent when she is active and exercises. Some radiation down the left leg and noticed some numbness when waking in her foot. History of endometriosis by laparoscopy in the 1996. Patient wondered whether is related to endometriosis. Having constipation that she does use a laxative on a regular basis. No urinary symptoms such as frequency dysuria or urgency. No nausea vomiting. No fever or chills. Postmenopausal LMP 2013. Currently on tamoxifen  Past medical history,surgical history, problem list, medications, allergies, family history and social history were all reviewed and documented in the EPIC chart.  Directed ROS with pertinent positives and negatives documented in the history of present illness/assessment and plan.  Exam: Caryn Bee assistant Vitals:   02/09/17 1434  BP: 114/66   General appearance:  Normal Spine straight without CVA tenderness Abdomen soft nontender without masses guarding rebound Pelvic external BUS vagina with atrophic changes. Cervix normal. Uterus normal size midline mobile nontender. Adnexa without masses or tenderness. Rectal exam is normal.  Assessment/Plan:  51 y.o. G2P2 left back pain with some radiation down the leg and associated numbness on occasion. Suspicious for orthopedic/disc. No significant GI or GU symptoms. Urinalysis is negative today. Seems worse at night when laying down. Doubt endometriosis related given postmenopausal status and description of the pain. Is on tamoxifen with possible stimulation of endometrium. Recommended start with ultrasound rule out gross gynecologic abnormalities. If negative then recommended orthopedic evaluation. If pain not felt to be orthopedic options for laparoscopy to include possible BSO at that time  discussed.    Anastasio Auerbach MD, 3:11 PM 02/09/2017

## 2017-02-09 NOTE — Addendum Note (Signed)
Addended by: Nelva Nay on: 02/09/2017 03:21 PM   Modules accepted: Orders

## 2017-02-09 NOTE — Patient Instructions (Signed)
Follow up for ultrasound as scheduled 

## 2017-03-02 ENCOUNTER — Ambulatory Visit (INDEPENDENT_AMBULATORY_CARE_PROVIDER_SITE_OTHER): Payer: BLUE CROSS/BLUE SHIELD | Admitting: Gynecology

## 2017-03-02 ENCOUNTER — Encounter: Payer: Self-pay | Admitting: Gynecology

## 2017-03-02 ENCOUNTER — Ambulatory Visit (INDEPENDENT_AMBULATORY_CARE_PROVIDER_SITE_OTHER): Payer: BLUE CROSS/BLUE SHIELD

## 2017-03-02 VITALS — BP 118/76

## 2017-03-02 DIAGNOSIS — R102 Pelvic and perineal pain: Secondary | ICD-10-CM

## 2017-03-02 NOTE — Progress Notes (Signed)
    [name] 1966-03-17 [id number]        51 y.o.  G2P2 presents for ultrasound. History of left-sided pain as described in her 02/09/2017 noted.  Past medical history,surgical history, problem list, medications, allergies, family history and social history were all reviewed and documented in the EPIC chart.  Directed ROS with pertinent positives and negatives documented in the history of present illness/assessment and plan.  Exam:  Vitals:   03/02/17 1035  BP: 118/76   General appearance:  Normal  Ultrasound transvaginal and transabdominal shows uterus normal size and echotexture. Endometrial echo 4.4 mm. Right and left ovaries visualized and atrophic in appearance. Adnexa without pathology visualized. Cul-de-sac negative.  Assessment/Plan:  51 y.o. G2P2 with history of left-sided pain and some radiation down her left leg. Wakes her up at night. Less prominent when she is active and exercises. Reviewed with her as before I think this probably hasn't orthopedic origin and I recommended that she follow up with orthopedics to see if she is having some issues with her lower back. She does not have other associated GI or GU symptoms. From my standpoint the next step would be diagnostic laparoscopy. Patient is going to follow up with orthopedics first and then with me if she wants to consider this option.    Anastasio Auerbach MD, 10:41 AM 03/02/2017

## 2017-03-02 NOTE — Patient Instructions (Signed)
Follow-up with orthopedics as we discussed.

## 2017-05-29 ENCOUNTER — Encounter: Payer: BLUE CROSS/BLUE SHIELD | Admitting: Adult Health

## 2017-06-25 ENCOUNTER — Other Ambulatory Visit: Payer: Self-pay | Admitting: Gynecology

## 2017-06-25 DIAGNOSIS — Z853 Personal history of malignant neoplasm of breast: Secondary | ICD-10-CM

## 2017-07-03 ENCOUNTER — Other Ambulatory Visit: Payer: Self-pay | Admitting: Gynecology

## 2017-07-03 ENCOUNTER — Ambulatory Visit
Admission: RE | Admit: 2017-07-03 | Discharge: 2017-07-03 | Disposition: A | Discharge status: Home / Self Care | Payer: BLUE CROSS/BLUE SHIELD | Source: Ambulatory Visit | Attending: Gynecology | Admitting: Gynecology

## 2017-07-03 DIAGNOSIS — Z853 Personal history of malignant neoplasm of breast: Secondary | ICD-10-CM

## 2017-07-03 HISTORY — DX: Personal history of irradiation: Z92.3

## 2017-10-06 DIAGNOSIS — J310 Chronic rhinitis: Secondary | ICD-10-CM | POA: Insufficient documentation

## 2017-10-06 DIAGNOSIS — J302 Other seasonal allergic rhinitis: Secondary | ICD-10-CM | POA: Insufficient documentation

## 2017-10-06 DIAGNOSIS — R519 Headache, unspecified: Secondary | ICD-10-CM | POA: Insufficient documentation

## 2017-12-24 ENCOUNTER — Telehealth: Payer: Self-pay | Admitting: Hematology and Oncology

## 2017-12-24 NOTE — Assessment & Plan Note (Deleted)
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 Return back to February 2015 and started tamoxifen 20 mg daily, BRCA1 and BRCA2 negative  Tamoxifen toxicities:  1. Severe hot flashes 2. Vaginal dryness  In spite of these symptoms, she is willing to take the tamoxifen.  Lump underneath the breast:  Breast Cancer Surveillance: 1. Breast exam 12/24/17: Bilateral breast reconstructions, benign 2. Mammogram 07/03/17: Postlumpectomy changes no suspicious masses. Breast Density Category C. I recommended that she get 3-D mammograms for surveillance.  Return to clinic in 1 year for follow-up

## 2017-12-24 NOTE — Telephone Encounter (Signed)
Patient called 2/20 and left message that she was unaware of why she was scheduled for appointment.  Vonte made appointment so I sent her an In basket message regarding this phone call.

## 2017-12-25 ENCOUNTER — Ambulatory Visit: Payer: BLUE CROSS/BLUE SHIELD | Admitting: Hematology and Oncology

## 2018-01-05 DIAGNOSIS — J3489 Other specified disorders of nose and nasal sinuses: Secondary | ICD-10-CM | POA: Insufficient documentation

## 2018-01-05 DIAGNOSIS — M950 Acquired deformity of nose: Secondary | ICD-10-CM | POA: Insufficient documentation

## 2018-01-05 DIAGNOSIS — J342 Deviated nasal septum: Secondary | ICD-10-CM | POA: Insufficient documentation

## 2018-02-03 DIAGNOSIS — J3489 Other specified disorders of nose and nasal sinuses: Secondary | ICD-10-CM | POA: Insufficient documentation

## 2018-04-13 ENCOUNTER — Ambulatory Visit: Payer: BLUE CROSS/BLUE SHIELD | Admitting: Gynecology

## 2018-04-13 ENCOUNTER — Encounter: Payer: Self-pay | Admitting: Gynecology

## 2018-04-13 VITALS — BP 116/74

## 2018-04-13 DIAGNOSIS — N941 Unspecified dyspareunia: Secondary | ICD-10-CM

## 2018-04-13 DIAGNOSIS — N952 Postmenopausal atrophic vaginitis: Secondary | ICD-10-CM

## 2018-04-13 MED ORDER — ESTRADIOL 10 MCG VA TABS: 1.0000 | ORAL_TABLET | VAGINAL | 11 refills | Status: DC

## 2018-04-13 NOTE — Patient Instructions (Addendum)
Chart the Vagifem tablets twice weekly.  Use self dilatation after a month or so of the Vagifem to see if this does not help with the discomfort.  Follow-up in 2 months for your annual exam

## 2018-04-13 NOTE — Progress Notes (Signed)
    [name] 1965/11/26 [id number]        52 y.o.  G2P2 presents complaining of painful intercourse on the right side introital opening.  She has a long history of painful intercourse and has been treated with Vagifem in the past for atrophic vaginal changes.  Was evaluated last year for left-sided pain with a negative ultrasound.  She feels that she is having adequate lubrication but is very uncomfortable with penetration on the right side.  Past medical history,surgical history, problem list, medications, allergies, family history and social history were all reviewed and documented in the EPIC chart.  Directed ROS with pertinent positives and negatives documented in the history of present illness/assessment and plan.  Exam: Caryn Bee assistant Vitals:   04/13/18 1100  BP: 116/74   General appearance:  Normal Abdomen soft nontender without masses guarding rebound Pelvic external BUS vagina with atrophic changes.  Patient is tender right around the right radial opening as well as to the level of the levator muscles.  No deep discomfort on bimanual.  Cervix normal.  Uterus normal size midline mobile nontender.  Adnexa without masses or tenderness.  Assessment/Plan:  52 y.o. G2P2 with painful intercourse primarily on the right.  Exam shows atrophic changes and some tenderness along the levators consistent with a vaginismus type pattern.  We discussed various options.  She had used Vagifem in the past.  She does have a history of breast cancer and we had discussed the issues of absorption with a systemic estrogen exposure, possible increased recurrence risk of the breast cancer as well as other exposure risks such as thrombosis and endometrial stimulation.  She had discussed this with her oncologist in the past who was okay with this.  At this point the patient would like to reinitiate Vagifem.  We discussed vaginal estrogen cream but she would prefer the Vagifem.  I also discussed after a  month or so to initiate self dilatation to see if we cannot break the vaginismus type pattern of discomfort and see if this does not help.  Abundant lubrication also recommended.  Patient's overdue for her annual and have asked her to schedule this in 2 months in follow-up and will see how she is doing at that time.  Vagifem 10 mcg twice weekly prescribed.  Greater than 50% of my time was spent in direct face to face counseling and coordination of care with the patient.     Anastasio Auerbach MD, 11:42 AM 04/13/2018

## 2018-04-15 ENCOUNTER — Telehealth: Payer: Self-pay | Admitting: *Deleted

## 2018-04-15 NOTE — Telephone Encounter (Signed)
Patient was seen for OV on 04/13/18 prescribed generic vagifem 10 mcg twice weekly, inserted one tablet last night for the first time and c/o lots of discomfort on the right side same as noted at office visit, also had burning sensation after inserting tablet. This am she have very light spotting on her panties this am when she woke up. I told her the spotting could be related to inserting the tablet. She wanted you to be aware of this. No discomfort now.  Please advise

## 2018-04-15 NOTE — Telephone Encounter (Signed)
Patient informed. 

## 2018-04-15 NOTE — Telephone Encounter (Signed)
I wonder if she caused a little bit of trauma with the applicator.  I would try it again and see how she does.  If it continues to be an issue we could switch to the cream.

## 2018-06-03 ENCOUNTER — Encounter: Payer: BLUE CROSS/BLUE SHIELD | Admitting: Gynecology

## 2018-06-03 ENCOUNTER — Ambulatory Visit: Payer: BLUE CROSS/BLUE SHIELD | Admitting: Gynecology

## 2018-06-03 ENCOUNTER — Encounter: Payer: Self-pay | Admitting: Gynecology

## 2018-06-03 VITALS — BP 110/78 | Ht 64.0 in | Wt 116.0 lb

## 2018-06-03 DIAGNOSIS — Z01419 Encounter for gynecological examination (general) (routine) without abnormal findings: Secondary | ICD-10-CM | POA: Diagnosis not present

## 2018-06-03 DIAGNOSIS — Z853 Personal history of malignant neoplasm of breast: Secondary | ICD-10-CM | POA: Diagnosis not present

## 2018-06-03 DIAGNOSIS — N952 Postmenopausal atrophic vaginitis: Secondary | ICD-10-CM

## 2018-06-03 DIAGNOSIS — R87610 Atypical squamous cells of undetermined significance on cytologic smear of cervix (ASC-US): Secondary | ICD-10-CM

## 2018-06-03 HISTORY — DX: Atypical squamous cells of undetermined significance on cytologic smear of cervix (ASC-US): R87.610

## 2018-06-03 MED ORDER — ESTRADIOL 10 MCG VA TABS: 1.0000 | ORAL_TABLET | VAGINAL | 12 refills | Status: DC

## 2018-06-03 NOTE — Addendum Note (Signed)
Addended by: Lorine Bears on: 06/03/2018 09:53 AM   Modules accepted: Orders

## 2018-06-03 NOTE — Progress Notes (Signed)
    REHA MARTINOVICH [birth year] [id number]        52 y.o.  G2P2 for annual gynecologic exam.  Doing well without gynecologic complaints  Past medical history,surgical history, problem list, medications, allergies, family history and social history were all reviewed and documented as reviewed in the EPIC chart.  ROS:  Performed with pertinent positives and negatives included in the history, assessment and plan.   Additional significant findings : None   Exam: Wandra Scot assistant Vitals:   06/03/18 0810  BP: 110/78  Weight: 116 lb (52.6 kg)  Height: 5\' 4"  (1.626 m)   Body mass index is 19.91 kg/m.  General appearance:  Normal affect, orientation and appearance. Skin: Grossly normal HEENT: Without gross lesions.  No cervical or supraclavicular adenopathy. Thyroid normal.  Lungs:  Clear without wheezing, rales or rhonchi Cardiac: RR, without RMG Abdominal:  Soft, nontender, without masses, guarding, rebound, organomegaly or hernia Breasts:  Examined lying and sitting without masses, retractions, discharge or axillary adenopathy.  Bilateral implants noted.  Well-healed left lumpectomy scar Pelvic:  Ext, BUS, Vagina: Normal with mild atrophic changes  Cervix: Normal with atrophic changes.  Pap smear done  Uterus: Anteverted, normal size, shape and contour, midline and mobile nontender   Adnexa: Without masses or tenderness    Anus and perineum: Normal   Rectovaginal: Normal sphincter tone without palpated masses or tenderness.    Assessment/Plan:  52 y.o. G2P2 female for annual gynecologic exam.   1. Postmenopausal/atrophic genital changes.  Having significant vaginal dryness/dyspareunia.  Started on Vagifem and notes significant improvement.  We have discussed multiple times in the past the issues of vaginal estrogen in the breast cancer survivor.  Possible absorption with increased risk of recurrence as well as the thrombosis and endometrial stimulation issues.  Patient comfortable  continuing and I refilled her x1 year.  No bleeding or significant hot flashes/sweats. 2. Pap smear 2016.  Pap smear done today.  No history of significant abnormal Pap smears. 3. Mammography coming due in August and I reminded her to schedule that.  Exam NED.  Continue to follow-up with oncologist. 4. Health maintenance.  No routine lab work done as patient reports this done elsewhere.  Follow-up 1 year, sooner as needed.   Anastasio Auerbach MD, 8:31 AM 06/03/2018

## 2018-06-03 NOTE — Patient Instructions (Signed)
Follow-up in 1 year for annual exam, sooner if any issues. 

## 2018-06-08 ENCOUNTER — Encounter: Payer: Self-pay | Admitting: Gynecology

## 2018-06-08 LAB — PAP, TP IMAGING W/ HPV RNA, RFLX HPV TYPE 16,18/45: HPV DNA HIGH RISK: NOT DETECTED

## 2018-06-14 ENCOUNTER — Telehealth: Payer: Self-pay

## 2018-06-14 NOTE — Telephone Encounter (Signed)
Patient called inquiring about her Pap smear result. I informed her that I sent My Chart e-mail with Dr. Dorette Grate note on 06/08/18 and see that she has not read it.  I explained Pap result and need for repeat next year.

## 2018-06-17 ENCOUNTER — Other Ambulatory Visit: Payer: Self-pay | Admitting: Gynecology

## 2018-06-17 DIAGNOSIS — Z1231 Encounter for screening mammogram for malignant neoplasm of breast: Secondary | ICD-10-CM

## 2018-09-28 ENCOUNTER — Ambulatory Visit
Admission: RE | Admit: 2018-09-28 | Discharge: 2018-09-28 | Disposition: A | Discharge status: Home / Self Care | Payer: BLUE CROSS/BLUE SHIELD | Source: Ambulatory Visit | Attending: Gynecology | Admitting: Gynecology

## 2018-09-28 DIAGNOSIS — Z1231 Encounter for screening mammogram for malignant neoplasm of breast: Secondary | ICD-10-CM

## 2018-11-29 ENCOUNTER — Telehealth: Payer: Self-pay | Admitting: *Deleted

## 2018-11-29 NOTE — Telephone Encounter (Signed)
Patient called and left message in voicemail asking for return call. I called and left message to call.

## 2018-11-29 NOTE — Telephone Encounter (Signed)
Patient called back asking if you would be willing to prescribe Tamoxifen to help with migraines? Patient c/o weekly headaches for 2-3 days, states when she was taking tamoxifen she had no headaches and felt fine. She is no longer taking generic vagifem tablets only took then for 2 months, reports has issues with medication but couldn't remember what issues she had. No other issues besides the migraines. Please advise

## 2018-11-29 NOTE — Telephone Encounter (Signed)
Left message for pt to call.

## 2018-11-29 NOTE — Telephone Encounter (Signed)
That needs to be dealt with by the oncologist.  The studies have changed over time as far as how long the patient should stay on tamoxifen.  There is some evidence with prolonged use it may increase certain types of tumors.  I think she would be better off talking to the oncologist and seeing what they think as far as prescribing it.

## 2018-11-30 NOTE — Telephone Encounter (Signed)
Patient informed. 

## 2018-12-09 DIAGNOSIS — G43009 Migraine without aura, not intractable, without status migrainosus: Secondary | ICD-10-CM | POA: Insufficient documentation

## 2019-07-04 ENCOUNTER — Ambulatory Visit (INDEPENDENT_AMBULATORY_CARE_PROVIDER_SITE_OTHER): Payer: 59

## 2019-07-04 ENCOUNTER — Other Ambulatory Visit: Payer: Self-pay

## 2019-07-04 ENCOUNTER — Other Ambulatory Visit: Payer: Self-pay | Admitting: Podiatry

## 2019-07-04 ENCOUNTER — Ambulatory Visit (INDEPENDENT_AMBULATORY_CARE_PROVIDER_SITE_OTHER): Payer: 59 | Admitting: Podiatry

## 2019-07-04 ENCOUNTER — Encounter: Payer: Self-pay | Admitting: Podiatry

## 2019-07-04 VITALS — BP 126/81 | HR 78 | Resp 16

## 2019-07-04 DIAGNOSIS — M779 Enthesopathy, unspecified: Secondary | ICD-10-CM

## 2019-07-04 DIAGNOSIS — M205X2 Other deformities of toe(s) (acquired), left foot: Secondary | ICD-10-CM

## 2019-07-04 DIAGNOSIS — M722 Plantar fascial fibromatosis: Secondary | ICD-10-CM

## 2019-07-04 DIAGNOSIS — M205X9 Other deformities of toe(s) (acquired), unspecified foot: Secondary | ICD-10-CM | POA: Diagnosis not present

## 2019-07-04 MED ORDER — DICLOFENAC SODIUM 75 MG PO TBEC: 75.0000 mg | DELAYED_RELEASE_TABLET | Freq: Two times a day (BID) | ORAL | 2 refills | Status: DC

## 2019-07-04 NOTE — Progress Notes (Signed)
Subjective:   Patient ID: [name], female   DOB: 53 y.o.   MRN: [id number]   HPI Patient presents stating that she has had severe pain bottom of her left heel for around 5 months duration and states that the big toe joint seems somewhat enlarged bilateral and she is not sure about pain but does seem to think motion is reducing.  Patient does not smoke likes to be active   Review of Systems  All other systems reviewed and are negative.       Objective:  Physical Exam Vitals signs and nursing note reviewed.  Constitutional:      Appearance: She is well-developed.  Pulmonary:     Effort: Pulmonary effort is normal.  Musculoskeletal: Normal range of motion.  Skin:    General: Skin is warm.  Neurological:     Mental Status: She is alert.     Neurovascular status intact muscle strength was found to be adequate patient found to have inflammation and pain around the medial aspect of the left heel at its insertion calcaneus with moderate depression and moderate reduction of motion first MPJ right over left with mild discomfort in the right first MPJ      Assessment:  Acute plantar fasciitis left with inflammation along with hallux limitus with spur formation right over left     Plan:  H&P conditions reviewed and x-ray left foot with right foot to be x-rayed at next visit.  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

## 2019-07-04 NOTE — Patient Instructions (Signed)

## 2019-07-18 ENCOUNTER — Ambulatory Visit (INDEPENDENT_AMBULATORY_CARE_PROVIDER_SITE_OTHER): Payer: 59 | Admitting: Podiatry

## 2019-07-18 ENCOUNTER — Other Ambulatory Visit: Payer: Self-pay

## 2019-07-18 ENCOUNTER — Encounter: Payer: Self-pay | Admitting: Podiatry

## 2019-07-18 DIAGNOSIS — M205X9 Other deformities of toe(s) (acquired), unspecified foot: Secondary | ICD-10-CM

## 2019-07-18 DIAGNOSIS — M722 Plantar fascial fibromatosis: Secondary | ICD-10-CM

## 2019-07-18 NOTE — Progress Notes (Signed)
Subjective:   Patient ID: [name], female   DOB: 53 y.o.   MRN: [id number]   HPI Patient presents stating that she is having a lot less pain and she is walking with a better heel toe gait also had concerns about the big toe joint of both feet   ROS      Objective:  Physical Exam  Neurovascular status intact muscle strength found to be adequate range of motion within normal limits with patient's heel pain significantly improved with discomfort only upon deep palpation with moderate hallux limitus deformity bilateral nonsymptomatic     Assessment:  2 separate conditions one being plantar fasciitis improved the other being hallux limitus nonsymptomatic currently 8     Plan:  NP conditions reviewed discussed and at this point I do not recommend treatment I would recommend the utilization of anti-inflammatories physical therapy support and patient is discharged and will be seen back as needed with all questions answered today signed visit

## 2019-07-27 ENCOUNTER — Encounter: Payer: Self-pay | Admitting: Gynecology

## 2019-08-08 ENCOUNTER — Encounter: Payer: Self-pay | Admitting: Podiatry

## 2019-08-08 DIAGNOSIS — M79621 Pain in right upper arm: Secondary | ICD-10-CM

## 2019-08-08 DIAGNOSIS — M79622 Pain in left upper arm: Secondary | ICD-10-CM

## 2019-08-08 NOTE — Telephone Encounter (Signed)
Okay to order for the patient

## 2019-08-19 ENCOUNTER — Ambulatory Visit
Admission: RE | Admit: 2019-08-19 | Discharge: 2019-08-19 | Disposition: A | Discharge status: Home / Self Care | Payer: 59 | Source: Ambulatory Visit | Attending: Gynecology | Admitting: Gynecology

## 2019-08-19 ENCOUNTER — Ambulatory Visit
Admission: RE | Admit: 2019-08-19 | Discharge: 2019-08-19 | Disposition: A | Discharge status: Home / Self Care | Payer: BLUE CROSS/BLUE SHIELD | Source: Ambulatory Visit | Attending: Gynecology | Admitting: Gynecology

## 2019-08-19 ENCOUNTER — Other Ambulatory Visit: Payer: Self-pay

## 2019-08-19 ENCOUNTER — Other Ambulatory Visit: Payer: Self-pay | Admitting: Gynecology

## 2019-08-19 DIAGNOSIS — M79621 Pain in right upper arm: Secondary | ICD-10-CM

## 2019-08-19 DIAGNOSIS — M79622 Pain in left upper arm: Secondary | ICD-10-CM

## 2019-10-17 ENCOUNTER — Other Ambulatory Visit: Payer: Self-pay

## 2019-10-17 ENCOUNTER — Encounter: Payer: Self-pay | Admitting: Podiatry

## 2019-10-17 ENCOUNTER — Ambulatory Visit (INDEPENDENT_AMBULATORY_CARE_PROVIDER_SITE_OTHER): Payer: 59 | Admitting: Podiatry

## 2019-10-17 DIAGNOSIS — M205X2 Other deformities of toe(s) (acquired), left foot: Secondary | ICD-10-CM

## 2019-10-17 DIAGNOSIS — M205X9 Other deformities of toe(s) (acquired), unspecified foot: Secondary | ICD-10-CM

## 2019-10-17 DIAGNOSIS — M205X1 Other deformities of toe(s) (acquired), right foot: Secondary | ICD-10-CM | POA: Diagnosis not present

## 2019-10-17 DIAGNOSIS — M722 Plantar fascial fibromatosis: Secondary | ICD-10-CM | POA: Diagnosis not present

## 2019-10-17 NOTE — Progress Notes (Signed)
Subjective:   Patient ID: [name], female   DOB: 53 y.o.   MRN: [id number]   HPI Patient presents with severe reoccurrence of plantar fascial inflammation left and hallux limitus deformity functional bilateral   ROS      Objective:  Physical Exam  Acute pain in the center center medial aspect of the plantar fascial left with fluid buildup hallux limitus deformity noted bilateral with reduced range of motion     Assessment:  Acute plantar fasciitis left along with hallux limitus deformity     Plan:  H&P reviewed both conditions and the acute nature of pain and her inability to bear weight on her plantar left heel.  I did go ahead from the lateral side did sterile prep and reinjected the fascia plantarly 3 mg Kenalog 5 mg Xylocaine and then went ahead today I dispensed air fracture walker with instructions on usage and casted for functional orthotics to try to reduce heel stress and improve the motion of the big toe joint bilateral

## 2019-11-17 ENCOUNTER — Encounter: Payer: Self-pay | Admitting: Podiatry

## 2019-11-17 ENCOUNTER — Other Ambulatory Visit: Payer: Self-pay

## 2019-11-17 ENCOUNTER — Ambulatory Visit (INDEPENDENT_AMBULATORY_CARE_PROVIDER_SITE_OTHER): Payer: 59 | Admitting: Podiatry

## 2019-11-17 ENCOUNTER — Ambulatory Visit: Payer: 59 | Admitting: Orthotics

## 2019-11-17 DIAGNOSIS — M722 Plantar fascial fibromatosis: Secondary | ICD-10-CM

## 2019-11-17 DIAGNOSIS — M205X9 Other deformities of toe(s) (acquired), unspecified foot: Secondary | ICD-10-CM

## 2019-11-17 DIAGNOSIS — M79622 Pain in left upper arm: Secondary | ICD-10-CM

## 2019-11-17 NOTE — Progress Notes (Signed)
Patient picked up f/o; however dr. Paulla Dolly wants her to "wean" herself off of boot at a graduated pace.   If she has any discomfort, she will see me when she comes back in 1 month.

## 2019-11-17 NOTE — Progress Notes (Signed)
Subjective:   Patient ID: [name], female   DOB: 54 y.o.   MRN: [id number]   HPI Patient states feeling much better with occasional pain and states the boot has been very helpful   ROS      Objective:  Physical Exam  Neurovascular status intact with patient's left heel still painful with bone prominence plantar heel but overall improved from previous with patient picking up orthotics today     Assessment:  Plantar fasciitis improved left but still concerned about the pain that she has when I press deep into the tissue     Plan:  H&P reviewed condition and recommended the continuation of boot usage with gradual reduction and increase in orthotics.  I discussed long-term this may require shockwave therapy or surgery and we will make that decision based on response

## 2019-12-19 ENCOUNTER — Encounter: Payer: Self-pay | Admitting: Podiatry

## 2019-12-19 ENCOUNTER — Ambulatory Visit (INDEPENDENT_AMBULATORY_CARE_PROVIDER_SITE_OTHER): Payer: 59 | Admitting: Podiatry

## 2019-12-19 ENCOUNTER — Other Ambulatory Visit: Payer: Self-pay

## 2019-12-19 VITALS — Temp 97.9°F

## 2019-12-19 DIAGNOSIS — M722 Plantar fascial fibromatosis: Secondary | ICD-10-CM

## 2019-12-19 DIAGNOSIS — G4730 Sleep apnea, unspecified: Secondary | ICD-10-CM | POA: Insufficient documentation

## 2019-12-21 NOTE — Progress Notes (Signed)
Subjective:   Patient ID: [name], female   DOB: 54 y.o.   MRN: [id number]   HPI Patient states her heel is still really bothering her and she feels like she is just walking on a wall surface.  It is at the plantar insertion of the fascia and she also has a tight heel cord   ROS      Objective:  Physical Exam  Plantar fasciitis that has responded slightly to conservative treatment with a narrow heel and a prominent fascial band F2     Assessment:  Acute plantar fasciitis that so far not responding     Plan:  H&P reviewed condition I recommended shockwave therapy to try to reduce the inflammation and I would like to avoid surgery due to her narrow heel and diminished fat pad.  Also I think the arch and the calf muscle should be worked and she will work with [employer] officer to do this will be scheduled for this.  Today I went ahead and advised on continued air fracture walker to use as she is healing from procedure

## 2020-01-06 ENCOUNTER — Other Ambulatory Visit: Payer: Self-pay

## 2020-01-06 ENCOUNTER — Ambulatory Visit (INDEPENDENT_AMBULATORY_CARE_PROVIDER_SITE_OTHER): Payer: 59 | Admitting: *Deleted

## 2020-01-06 DIAGNOSIS — M722 Plantar fascial fibromatosis: Secondary | ICD-10-CM

## 2020-01-06 NOTE — Patient Instructions (Signed)

## 2020-01-06 NOTE — Progress Notes (Signed)
Patient presents for the 1st EPAT treatment today with complaint of plantar heel pain left. Diagnosed with plantar fasciitis by Dr. Paulla Dolly. This has been ongoing for several months. The patient has tried ice, stretching, NSAIDS and supportive shoe gear with no long term relief.   Most of the pain is located central and medial heel and arch.  ESWT administered and tolerated well. Treatment settings initiated at:   Energy: 20  Decreased energy due to patient sensitivity  Ended treatment session today with 3000 shocks at the following settings:   Energy: 10-12  Frequency: 6.0  Joules: 10.52  Arch massager was utilized x 3 rounds on the arch and the calf.  Advised to avoid ice and NSAIDs and to utilize boot or supportive shoes for at least the next 3 days.  Follow up for 2nd treatment in 1 week.

## 2020-01-20 ENCOUNTER — Ambulatory Visit (INDEPENDENT_AMBULATORY_CARE_PROVIDER_SITE_OTHER): Payer: Self-pay | Admitting: *Deleted

## 2020-01-20 ENCOUNTER — Other Ambulatory Visit: Payer: Self-pay

## 2020-01-20 DIAGNOSIS — M722 Plantar fascial fibromatosis: Secondary | ICD-10-CM

## 2020-01-20 NOTE — Progress Notes (Signed)
Patient presents for the 2nd EPAT treatment today with complaint of plantar heel pain left. Diagnosed with plantar fasciitis by Dr. Paulla Dolly. This has been ongoing for several months. The patient has tried ice, stretching, NSAIDS and supportive shoe gear with no long term relief.   Most of the pain is located central and medial heel and arch. She says it has been feeling a little better.  ESWT administered and tolerated well. Treatment settings initiated at:   Energy: 15  Decreased energy due to patient sensitivity  Ended treatment session today with 3000 shocks at the following settings:   Energy: 15   Frequency: 6.0  Joules: 14.72  Arch massager was utilized x 3 rounds on the arch.  Advised to avoid ice and NSAIDs and to utilize boot or supportive shoes for at least the next 3 days.  Follow up for 3rd treatment in 2 weeks due to scheduling conflict.

## 2020-02-02 ENCOUNTER — Ambulatory Visit: Payer: Self-pay | Admitting: *Deleted

## 2020-02-02 ENCOUNTER — Other Ambulatory Visit: Payer: Self-pay

## 2020-02-02 ENCOUNTER — Ambulatory Visit: Payer: 59 | Attending: Internal Medicine

## 2020-02-02 DIAGNOSIS — M722 Plantar fascial fibromatosis: Secondary | ICD-10-CM

## 2020-02-02 DIAGNOSIS — B351 Tinea unguium: Secondary | ICD-10-CM

## 2020-02-02 DIAGNOSIS — Z23 Encounter for immunization: Secondary | ICD-10-CM

## 2020-02-02 NOTE — Progress Notes (Signed)
Patient presents for the 3rd EPAT treatment today with complaint of plantar heel pain left. Diagnosed with plantar fasciitis by Dr. Paulla Dolly. This has been ongoing for several months. The patient has tried ice, stretching, NSAIDS and supportive shoe gear with no long term relief.   Most of the pain is located central and medial heel and arch. She is feeling better. She says it only really hurts at night.  ESWT administered and tolerated well. Treatment settings initiated at:   Energy: 20  Decreased energy due to patient sensitivity  Ended treatment session today with 3000 shocks at the following settings:   Energy: 20   Frequency: 5.0  Joules: 19.62  Arch massager was utilized x 3 rounds on the arch.  Advised to avoid ice and NSAIDs and to utilize boot or supportive shoes for at least the next 3 days.  Follow up for 4th treatment in 2 weeks.

## 2020-02-17 ENCOUNTER — Other Ambulatory Visit: Payer: 59

## 2020-02-24 ENCOUNTER — Other Ambulatory Visit: Payer: Self-pay

## 2020-02-24 ENCOUNTER — Encounter (INDEPENDENT_AMBULATORY_CARE_PROVIDER_SITE_OTHER): Payer: 59 | Admitting: *Deleted

## 2020-02-24 DIAGNOSIS — M722 Plantar fascial fibromatosis: Secondary | ICD-10-CM

## 2020-02-24 NOTE — Progress Notes (Signed)
This encounter was created in error - please disregard.

## 2020-02-27 ENCOUNTER — Ambulatory Visit: Payer: 59 | Attending: Internal Medicine

## 2020-02-27 DIAGNOSIS — Z23 Encounter for immunization: Secondary | ICD-10-CM

## 2020-02-27 NOTE — Progress Notes (Signed)
   Covid-19 Vaccination Clinic  Name:  ARVADA SCHWERTNER    MRN: [id number] DOB: 03-Feb-1966  02/27/2020  Ms. Thong was observed post Covid-19 immunization for 15 minutes without incident. She was provided with Vaccine Information Sheet and instruction to access the V-Safe system.   Ms. Cata was instructed to call 911 with any severe reactions post vaccine: Marland Kitchen Difficulty breathing  . Swelling of face and throat  . A fast heartbeat  . A bad rash all over body  . Dizziness and weakness   Immunizations Administered    Name Date Dose VIS Date Route   Pfizer COVID-19 Vaccine 02/27/2020  8:42 AM 0.3 mL 12/28/2018 Intramuscular   Manufacturer: [place]   Lot: [id number]   [place]: [id number]

## 2020-02-28 ENCOUNTER — Other Ambulatory Visit: Payer: Self-pay

## 2020-02-28 ENCOUNTER — Ambulatory Visit (INDEPENDENT_AMBULATORY_CARE_PROVIDER_SITE_OTHER): Payer: 59 | Admitting: *Deleted

## 2020-02-28 DIAGNOSIS — M722 Plantar fascial fibromatosis: Secondary | ICD-10-CM

## 2020-02-28 DIAGNOSIS — B351 Tinea unguium: Secondary | ICD-10-CM

## 2020-02-28 NOTE — Progress Notes (Signed)
Patient presents for the 4th EPAT treatment today with complaint of plantar heel pain left. Diagnosed with plantar fasciitis by Dr. Paulla Dolly. This has been ongoing for several months. The patient has tried ice, stretching, NSAIDS and supportive shoe gear with no long term relief.   Most of the pain is located central and medial heel and arch. She says her foot is feeling so much better, that she even wondered if she needed this last treatment.  ESWT administered and tolerated well. Treatment settings initiated at:   Energy: 25   Ended treatment session today with 3000 shocks at the following settings:   Energy: 25   Frequency: 4.0  Joules: 24.52  Arch massager was utilized x 3 rounds on the arch.  Advised to avoid ice and NSAIDs and to utilize boot or supportive shoes for at least the next 3 days.  Follow up with Dr. Paulla Dolly in 2 weeks.

## 2020-03-16 ENCOUNTER — Other Ambulatory Visit: Payer: Self-pay

## 2020-03-16 ENCOUNTER — Encounter: Payer: Self-pay | Admitting: Podiatry

## 2020-03-16 ENCOUNTER — Ambulatory Visit (INDEPENDENT_AMBULATORY_CARE_PROVIDER_SITE_OTHER): Payer: 59 | Admitting: Podiatry

## 2020-03-16 DIAGNOSIS — M722 Plantar fascial fibromatosis: Secondary | ICD-10-CM

## 2020-03-19 NOTE — Progress Notes (Signed)
Subjective:   Patient ID: [name], female   DOB: 54 y.o.   MRN: [id number]   HPI Patient states that shockwave has really helped her but she still gets some discomfort in her foot and as she increases activity she wants to make sure that she is moving forward properly   ROS      Objective:  Physical Exam  Neuro vascular status intact negative Bevelyn Buckles' sign noted patient's left heel is improved from where it was with diminished erythema edema and discomfort but she has a very narrow heel and she has a diminished fat pad     Assessment:  Inflammatory condition with probable fasciitis-like symptoms that does seem to be improving but still does at times flareup on her     Plan:  H&P spent a great deal of time educating her on her condition her diminished fat pad and her bone exposure.  At this point we will utilize shoe gear modifications cushioning and physical therapy along with oral anti-inflammatories topicals as needed.  If symptoms persist may consider other treatment but I am hoping she will continue to improve with the shockwave that is been administered

## 2020-05-28 DIAGNOSIS — S52501A Unspecified fracture of the lower end of right radius, initial encounter for closed fracture: Secondary | ICD-10-CM | POA: Insufficient documentation

## 2020-06-18 ENCOUNTER — Other Ambulatory Visit: Payer: Self-pay | Admitting: Physician Assistant

## 2020-06-18 DIAGNOSIS — R109 Unspecified abdominal pain: Secondary | ICD-10-CM

## 2020-09-17 ENCOUNTER — Other Ambulatory Visit: Payer: Self-pay

## 2020-09-17 ENCOUNTER — Other Ambulatory Visit: Payer: Self-pay | Admitting: Physician Assistant

## 2020-09-17 DIAGNOSIS — Z1231 Encounter for screening mammogram for malignant neoplasm of breast: Secondary | ICD-10-CM

## 2020-11-01 ENCOUNTER — Ambulatory Visit
Admission: RE | Admit: 2020-11-01 | Discharge: 2020-11-01 | Disposition: A | Discharge status: Home / Self Care | Payer: 59 | Source: Ambulatory Visit | Attending: Physician Assistant | Admitting: Physician Assistant

## 2020-11-01 ENCOUNTER — Other Ambulatory Visit: Payer: Self-pay

## 2020-11-01 DIAGNOSIS — Z1231 Encounter for screening mammogram for malignant neoplasm of breast: Secondary | ICD-10-CM

## 2020-11-25 IMAGING — MG MM  DIGITAL DIAGNOSTIC BREAST BILAT IMPLANT W/ TOMO W/ CAD
8 of 12 series · 8 of 28 positions shown · non-contrast
Comparison: Previous exam(s).

CLINICAL DATA: Intermittent bilateral axillary and diffuse lateral
left breast pain for the past year. Status post left lumpectomy and
radiation therapy for breast cancer diagnosed in 7949.

EXAM:
DIGITAL DIAGNOSTIC BILATERAL MAMMOGRAM WITH IMPLANTS, CAD AND TOMO
ULTRASOUND LEFT BREAST AND AXILLA
ULTRASOUND RIGHT AXILLA
The patient has retropectoral implants. Standard and implant
displaced views were performed.

[R MLO]
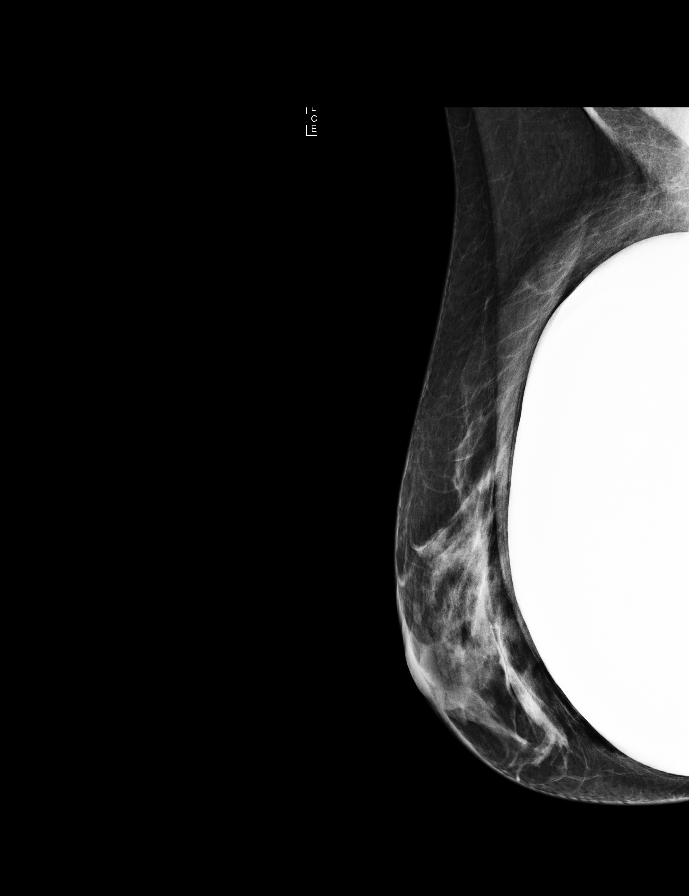

[R CC]
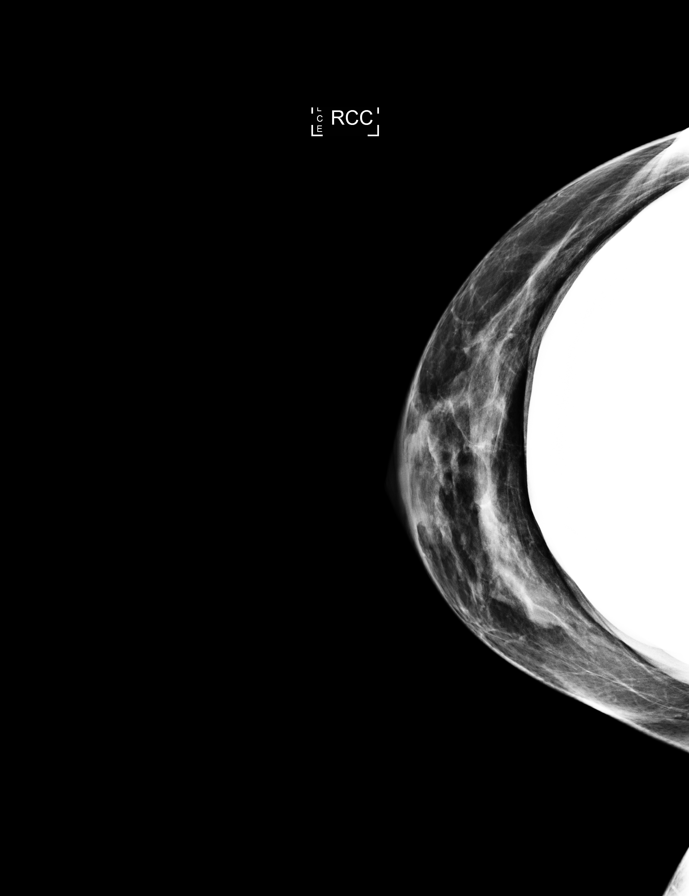

[L CC]
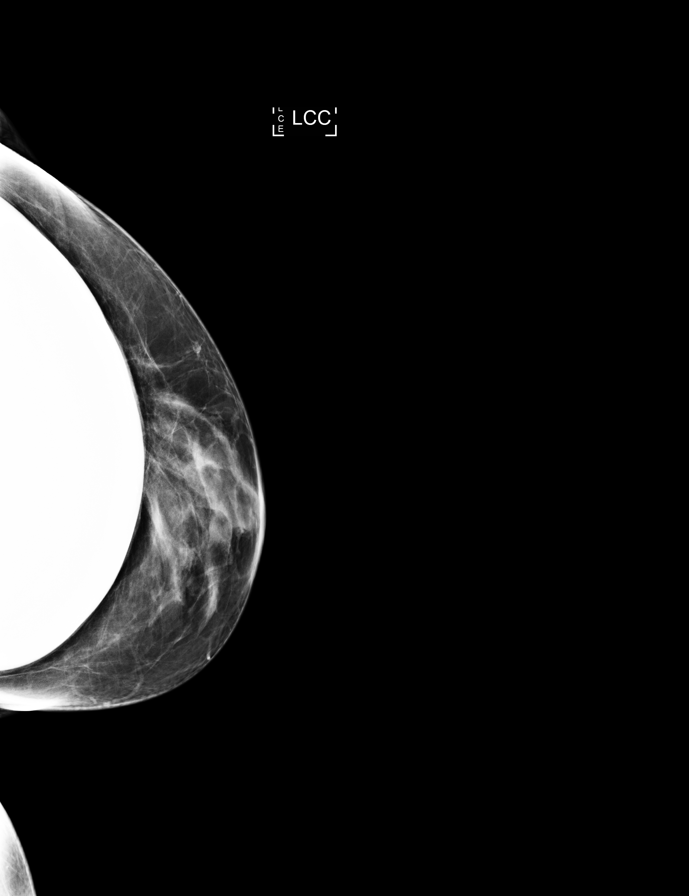

[L MLO]
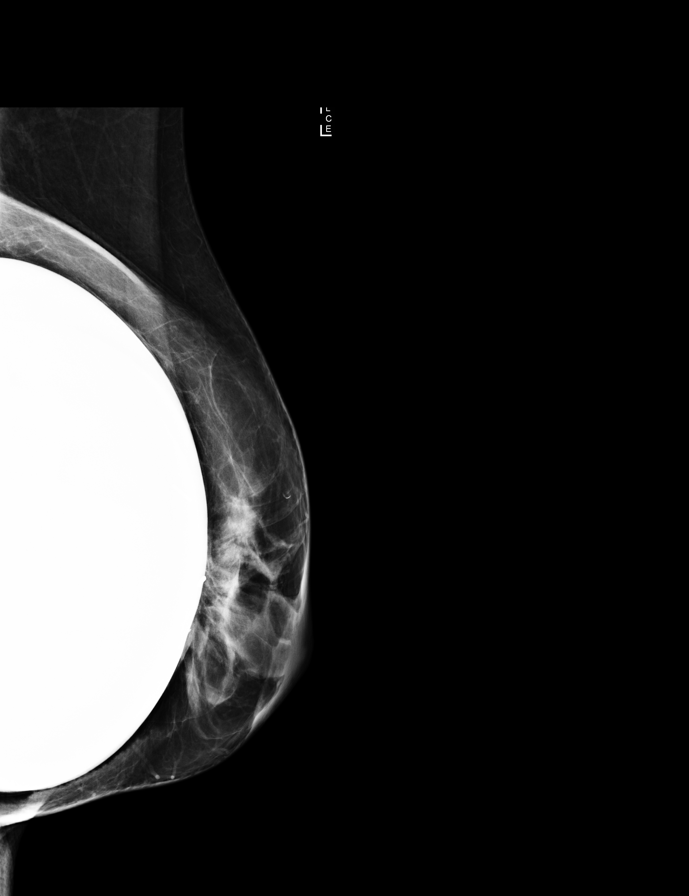

[L MLO synth-2D]
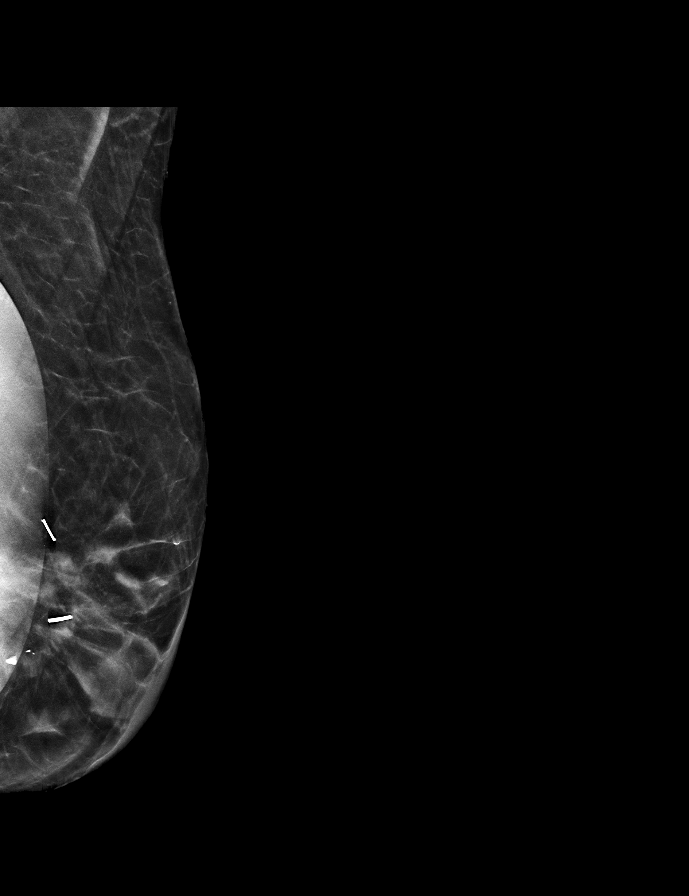

[L CC synth-2D]
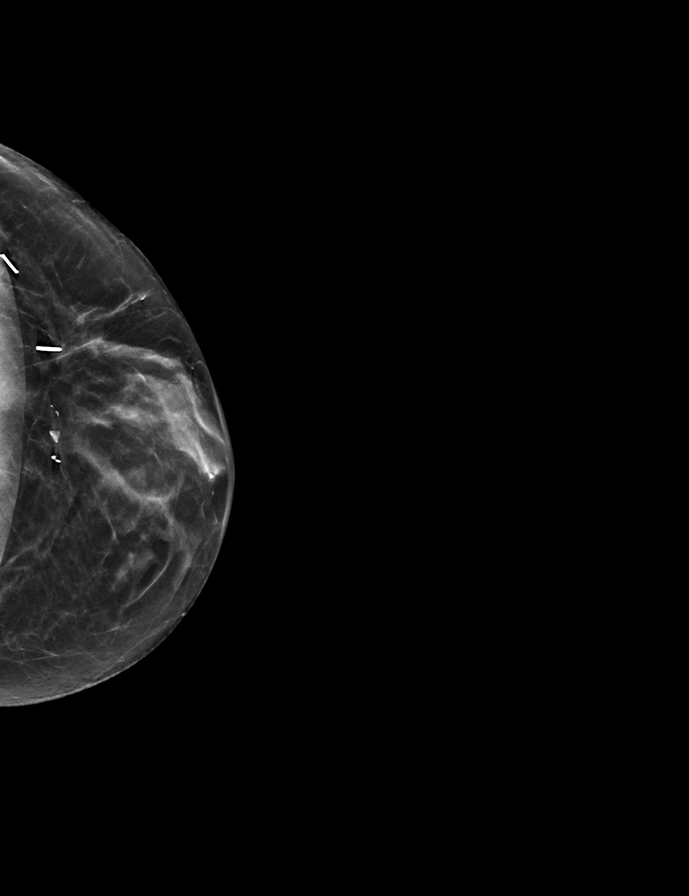

[R MLO synth-2D]
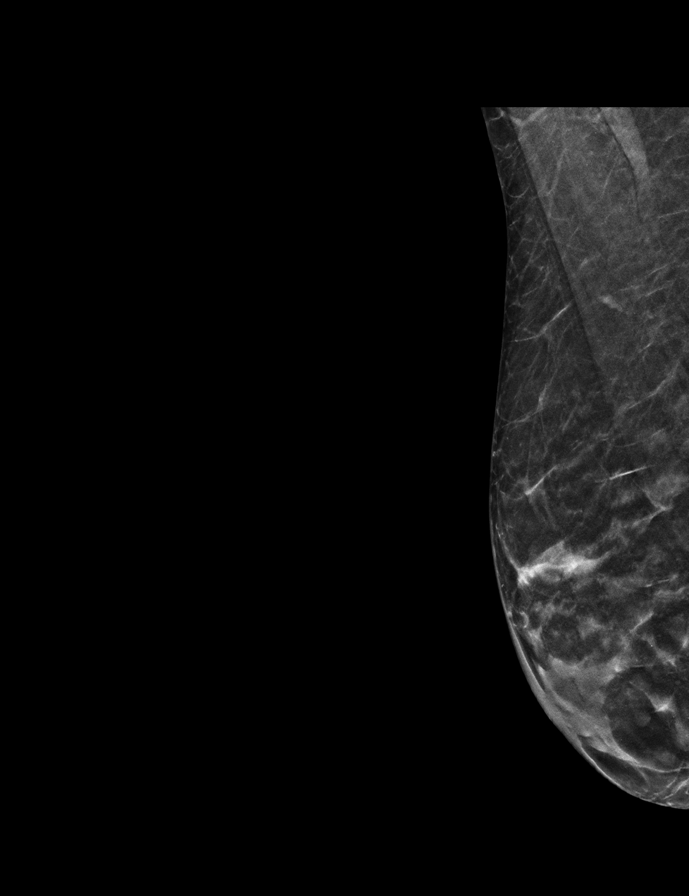

[R CC synth-2D]
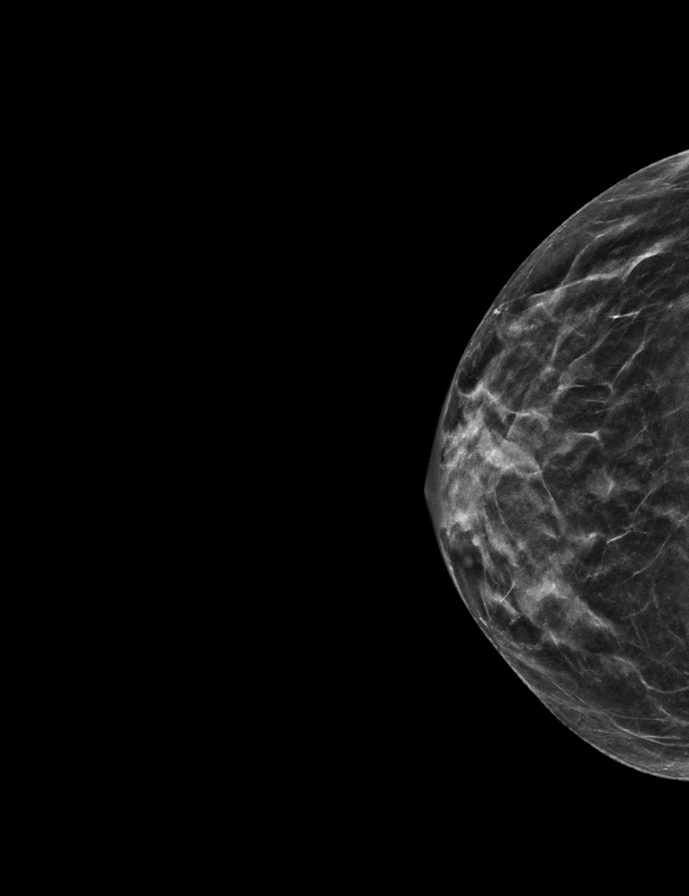

[8 of 28 positions shown; findings below may reference images not displayed]

ACR Breast Density Category c: The breast tissue is heterogeneously
dense, which may obscure small masses.
FINDINGS: Stable mammographic appearance of the breasts with no interval
findings suspicious for malignancy in either breast. No axillary
abnormalities are seen on either side.

On physical exam, no mass is palpable in the lateral left breast or
either axilla.

Targeted ultrasound is performed, showing normal appearing axillary
contents bilaterally, including normal appearing lymph nodes. An
intact implant is demonstrated in the lateral left breast with no
mass or other findings suspicious for malignancy.

Mammographic images were processed with CAD.
IMPRESSION: No evidence of malignancy.

RECOMMENDATION:
Bilateral screening mammogram in 1 year.

I have discussed the findings and recommendations with the patient.
If applicable, a reminder letter will be sent to the patient
regarding the next appointment.

BI-RADS CATEGORY  1: Negative.

## 2020-11-25 IMAGING — US US BREAST*L* LIMITED INC AXILLA
1 series · 9 of 9 positions shown · non-contrast
Comparison: Previous exam(s).

CLINICAL DATA: Intermittent bilateral axillary and diffuse lateral
left breast pain for the past year. Status post left lumpectomy and
radiation therapy for breast cancer diagnosed in 7949.

EXAM:
DIGITAL DIAGNOSTIC BILATERAL MAMMOGRAM WITH IMPLANTS, CAD AND TOMO
ULTRASOUND LEFT BREAST AND AXILLA
ULTRASOUND RIGHT AXILLA
The patient has retropectoral implants. Standard and implant
displaced views were performed.

[Series 1: us breast*left* limited inc axilla · 0.06mm/px · 9 of 9 slices shown]
[im 1/9]
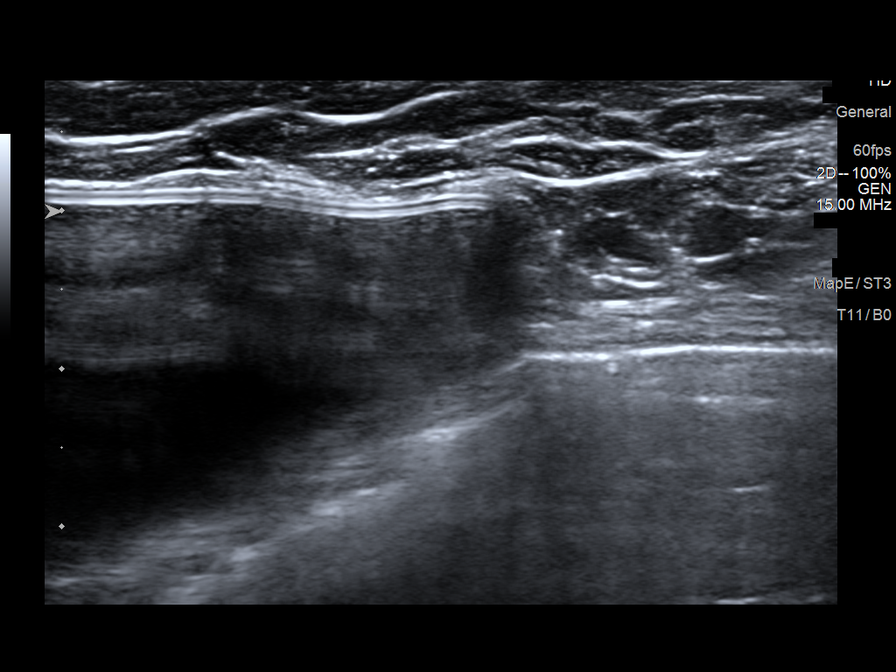
[im 2/9]
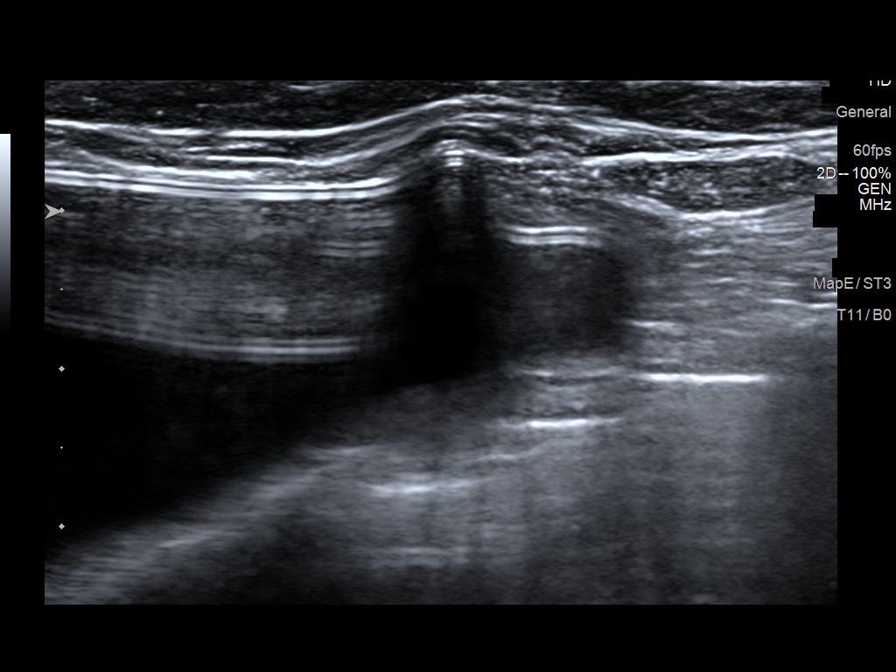
[im 3/9]
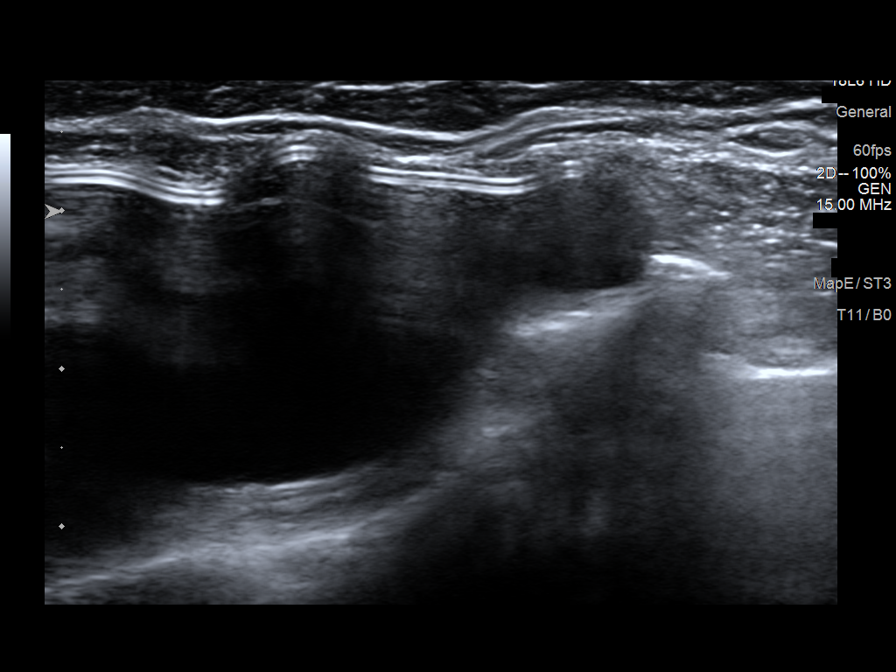
[im 4/9]
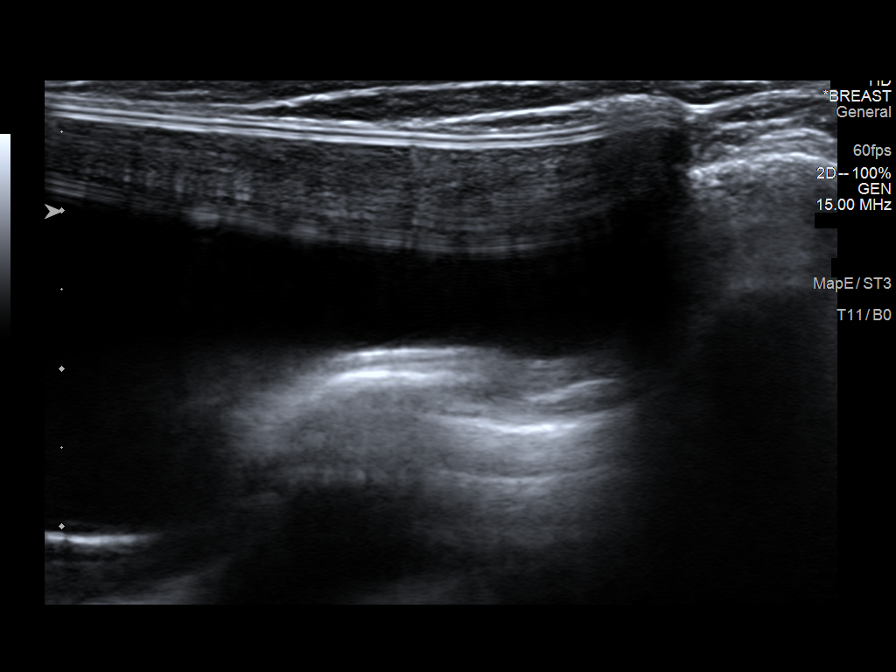
[im 5/9]
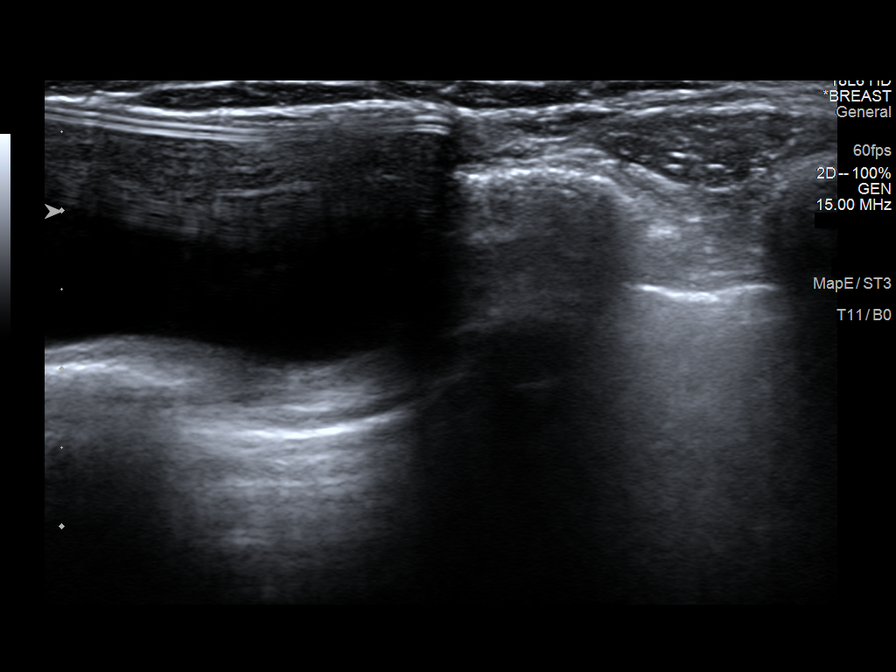
[im 6/9]
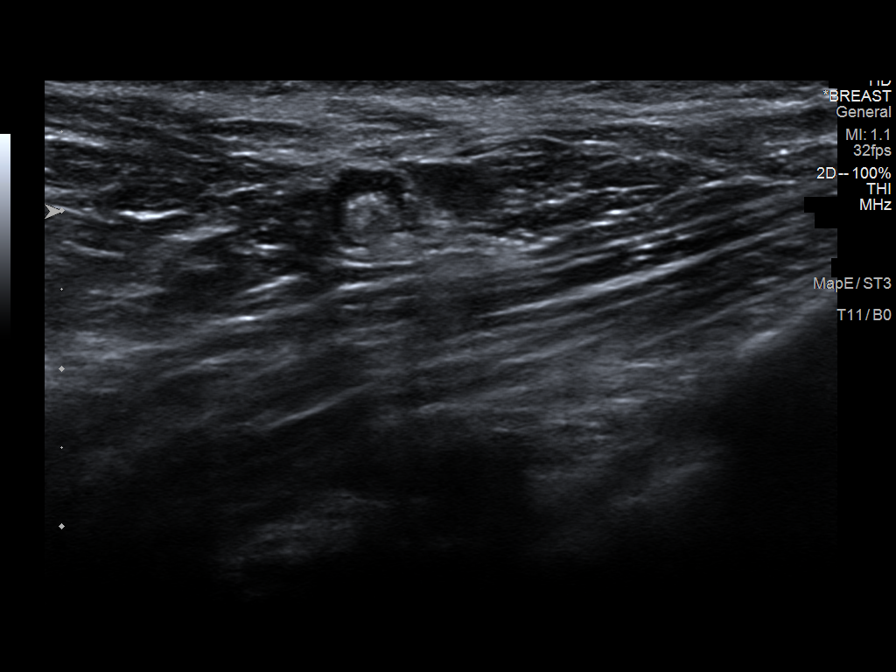
[im 7/9]
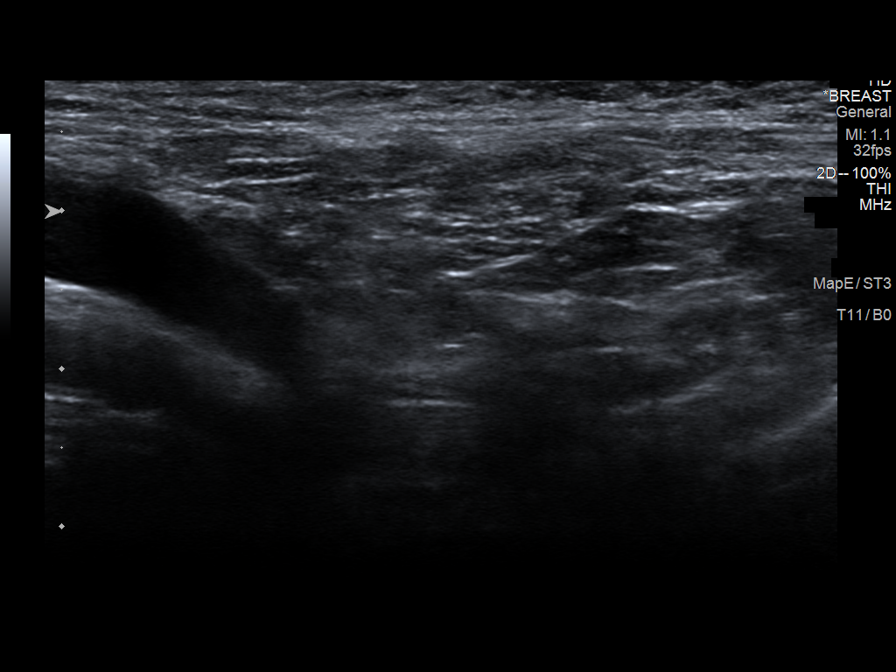
[im 8/9]
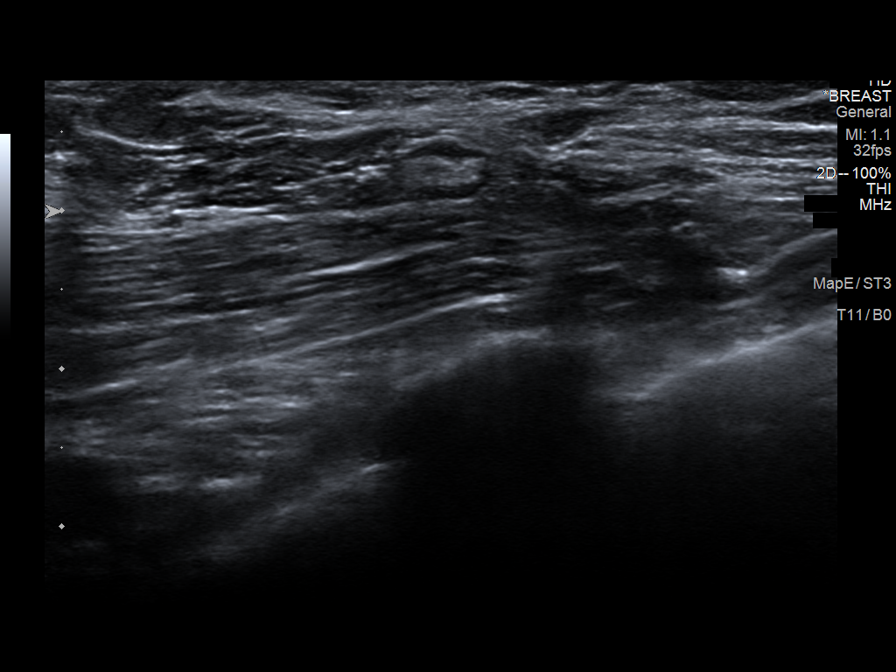
[im 9/9]
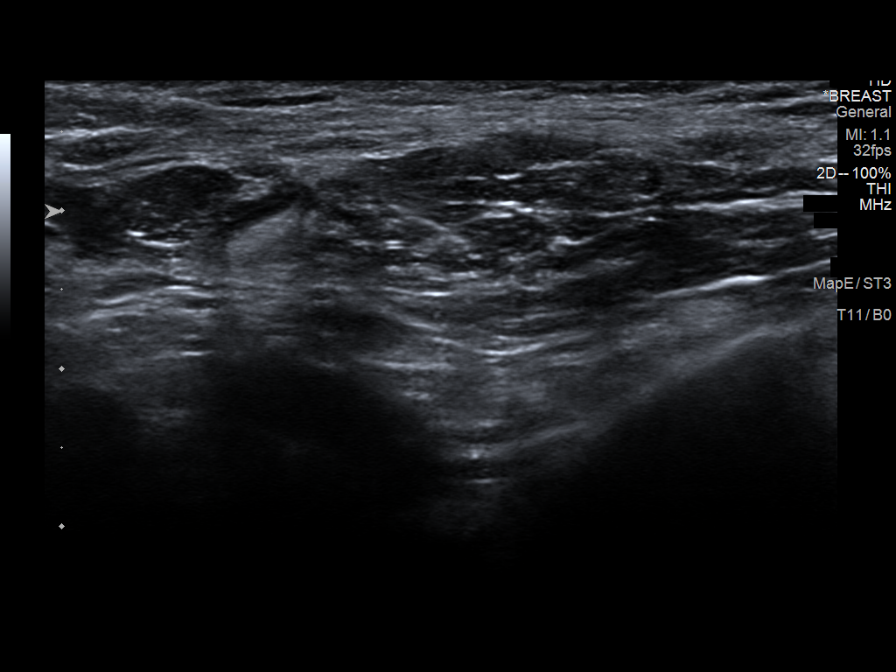

[9 of 9 positions shown; findings below may reference images not displayed]

ACR Breast Density Category c: The breast tissue is heterogeneously
dense, which may obscure small masses.
FINDINGS: Stable mammographic appearance of the breasts with no interval
findings suspicious for malignancy in either breast. No axillary
abnormalities are seen on either side.

On physical exam, no mass is palpable in the lateral left breast or
either axilla.

Targeted ultrasound is performed, showing normal appearing axillary
contents bilaterally, including normal appearing lymph nodes. An
intact implant is demonstrated in the lateral left breast with no
mass or other findings suspicious for malignancy.

Mammographic images were processed with CAD.
IMPRESSION: No evidence of malignancy.

RECOMMENDATION:
Bilateral screening mammogram in 1 year.

I have discussed the findings and recommendations with the patient.
If applicable, a reminder letter will be sent to the patient
regarding the next appointment.

BI-RADS CATEGORY  1: Negative.

## 2021-02-18 ENCOUNTER — Other Ambulatory Visit: Payer: Self-pay | Admitting: *Deleted

## 2021-02-18 DIAGNOSIS — R5381 Other malaise: Secondary | ICD-10-CM

## 2021-02-28 ENCOUNTER — Other Ambulatory Visit: Payer: Self-pay | Admitting: Plastic Surgery

## 2021-02-28 ENCOUNTER — Other Ambulatory Visit: Payer: Self-pay | Admitting: *Deleted

## 2021-02-28 DIAGNOSIS — N644 Mastodynia: Secondary | ICD-10-CM

## 2021-04-03 NOTE — Progress Notes (Signed)
Patient ID: [name], female   DOB: Feb 01, 1966, 55 y.o.   MRN: [id number]            Referring Provider:  Reason for Appointment:  Hypothyroidism, new visit    History of Present Illness:   Hypothyroidism was first diagnosed  when she was about 55 years old  At the time of diagnosis patient had been treated with radioactive iodine for hyperthyroidism  She says that when she has hypothyroidism she will be having symptoms of  fatigue, cold sensitivity, some weight change and dry skin.      The patient has been treated with  initially levothyroxine and since about 2017 has been on Armour Thyroid from a nurse practitioner in [place]  With starting Armour Thyroid supplementation the patient's symptoms appeared to be better controlled She was told that her TSH was low but the dose had been continued for some time on 90 mg Armour Thyroid  Although detailed records are not available she probably was reduced from 90 to 60 mg Armour Thyroid in 12/21 when she had a high T3 along with a low TSH level She said that for about 6 weeks after the change she actually felt less tired Subsequently she has been having increasing fatigue, cold intolerance especially after meals She has had some weight gain recently but this appears to be leveled off Has chronic dry skin  The patient takes the thyroid supplement before breakfast regularly  She has been referred here because of persistent symptoms as above         Patient's weight history is as follows:  Wt Readings from Last 3 Encounters:  04/04/21 119 lb 8 oz (54.2 kg)  06/03/18 116 lb (52.6 kg)  12/26/16 115 lb 11.2 oz (52.5 kg)    Thyroid function results have been as follows:  No results found for: TSH, FREET4, T3FREE   Date   free T4   TSH   T3   12/12/2020 0.66  0.32  3.6  10/2020  0.78  <0.01  4.8  10/11/2019    0.7       Past Medical History:  Diagnosis Date  . ASCUS of cervix with negative high risk HPV 06/2018  .  Breast cancer ([place]) 2010  . Hypothyroidism   . Migraine   . Personal history of radiation therapy    2011 left breast  . Urgency of urination     Past Surgical History:  Procedure Laterality Date  . AUGMENTATION MAMMAPLASTY Bilateral   . BREAST LUMPECTOMY Left 09/25/2009  . BREAST SURGERY  2010   left  . BREAST SURGERY  04/2015  . CAPSULECTOMY Left 07/20/2014   Procedure: LEFT BREAST CAPSULECTOMY/REPLACE IMPLANT FOR RECONSTRUCTION OF LEFT BREAST;  Surgeon: Crissie Reese, MD;  Location: [facility];  Service: Plastics;  Laterality: Left;  . DIAGNOSTIC LAPAROSCOPY  1996   Endometriosis    Family History  Problem Relation Age of Onset  . Diabetes Mother   . Hyperlipidemia Mother   . Hyperlipidemia Father   . Hypertension Father   . Heart disease Father     Social History:  reports that she has never smoked. She has never used smokeless tobacco. She reports that she does not drink alcohol and does not use drugs.  Allergies:  Allergies  Allergen Reactions  . Topiramate Other (See Comments)    Headache    Allergies as of 04/04/2021      Reactions   Topiramate Other (See Comments)   Headache  Medication List       Accurate as of April 04, 2021  2:30 PM. If you have any questions, ask your nurse or doctor.        Armour Thyroid 90 MG tablet Generic drug: thyroid Take 90 mg by mouth daily.   Cholecalciferol 100 MCG (4000 UT) Caps Take 4,000 Units by mouth daily.   cyclobenzaprine 5 MG tablet Commonly known as: FLEXERIL Take 5 mg by mouth 3 (three) times daily as needed for muscle spasms.   diclofenac 75 MG EC tablet Commonly known as: VOLTAREN Take 1 tablet (75 mg total) by mouth 2 (two) times daily.   Emgality 120 MG/ML Sosy Generic drug: Galcanezumab-gnlm   Fish Oil 1000 MG Caps Take by mouth daily.   fluticasone 50 MCG/ACT nasal spray Commonly known as: FLONASE Place into the nose.          Review of Systems  Constitutional: Positive for reduced  appetite.       Variable  HENT: Positive for headaches.        Has history of significant migraines associated with nausea  Eyes: Negative for visual disturbance.  Respiratory: Negative for shortness of breath.   Cardiovascular: Negative for leg swelling.  Gastrointestinal: Positive for nausea.       Has nausea only when she has headaches  Endocrine: Positive for fatigue. Negative for light-headedness.       She does have hot flashes especially at night  Musculoskeletal:       Sometimes will have pain in her shins, occasionally in finger joints  Skin: Positive for dry skin.  Neurological: Negative for numbness.   She avoids milk because of lactose intolerance and also tries to avoid gluten in her diet       Occasionally will have breast tenderness        Examination:    BP (!) 98/56 (BP Location: Left Arm, Patient Position: Sitting, Cuff Size: Normal)   Pulse 79   Ht 5\' 4"  (1.626 m)   Wt 119 lb 8 oz (54.2 kg)   LMP 09/22/2012   SpO2 99%   BMI 20.51 kg/m   GENERAL:  Average build.   No pallor of skin or conjunctival.    Skin:  no rash or significant skin lesions.  No pigmentation of upper extremities on exposed areas  EYES:  No prominence of the eyes or swelling of the eyelids  ENT: Oral mucosa and tongue normal.  No oral or inner lip pigmentation  NECK: No lymphadenopathy  THYROID:  Not palpable.  HEART:  Normal  S1 and S2; no murmur or click.  CHEST:    Lungs: Vescicular breath sounds heard equally.  No crepitations/ wheeze.  ABDOMEN:  No distention.  Liver and spleen not palpable.  No other mass or tenderness.  NEUROLOGICAL: Reflexes are bilaterally normal at biceps or ankles No gross motor weakness.  EXTREMITIES: Mild swelling of a few of her finger joints.  No ankle edema present   Assessment:  HYPOTHYROIDISM, post radioactive iodine  She has been on Armour Thyroid for the last few years which apparently has more effective than levothyroxine and  relieving her symptoms of hypothyroidism However more recently she has had iatrogenic hyperthyroidism which was subclinical as of 10/2020 especially with high free T3 levels Even though she is back on 90 mg of for the last couple of months or so she still has fatigue and cold intolerance Not clear if she is having a lack.  Between the changing  of her doses and symptoms  Clinically no suggestion of adrenal insufficiency even though her blood pressure may be low normal, this appears to be unchanged from previous levels and has no orthostasis, symptoms of weight loss or nausea  No history of anemia   PLAN:   Recheck thyroid levels including T3 Discussed that for better adjustment of her thyroid supplementation and individualization of liothyronine and levothyroxine doses would prefer that she stop Armour Thyroid and take individual hormone separately This will be decided based on her labs Also check B12 levels for fatigue  Follow-up 6 weeks   Elayne Snare 04/04/2021, 2:30 PM   Consultation note copy sent to the PCP  Note: This office note was prepared with Dragon voice recognition system technology. Any transcriptional errors that result from this process are unintentional.

## 2021-04-04 ENCOUNTER — Encounter: Payer: Self-pay | Admitting: Endocrinology

## 2021-04-04 ENCOUNTER — Ambulatory Visit (INDEPENDENT_AMBULATORY_CARE_PROVIDER_SITE_OTHER): Payer: 59 | Admitting: Endocrinology

## 2021-04-04 ENCOUNTER — Other Ambulatory Visit: Payer: Self-pay

## 2021-04-04 VITALS — BP 104/76 | HR 79 | Ht 64.0 in | Wt 119.5 lb

## 2021-04-04 DIAGNOSIS — R5383 Other fatigue: Secondary | ICD-10-CM | POA: Diagnosis not present

## 2021-04-04 DIAGNOSIS — E890 Postprocedural hypothyroidism: Secondary | ICD-10-CM

## 2021-04-04 LAB — T3, FREE: T3, Free: 3.1 pg/mL (ref 2.3–4.2)

## 2021-04-04 LAB — TSH: TSH: 0.07 u[IU]/mL — ABNORMAL LOW (ref 0.35–4.50)

## 2021-04-04 LAB — T4, FREE: Free T4: 0.93 ng/dL (ref 0.60–1.60)

## 2021-04-05 LAB — VITAMIN B12: Vitamin B-12: 433 pg/mL (ref 211–911)

## 2021-05-24 ENCOUNTER — Ambulatory Visit: Payer: 59 | Admitting: Endocrinology

## 2021-05-27 ENCOUNTER — Other Ambulatory Visit: Payer: Self-pay

## 2021-05-27 ENCOUNTER — Other Ambulatory Visit (INDEPENDENT_AMBULATORY_CARE_PROVIDER_SITE_OTHER): Payer: 59

## 2021-05-27 DIAGNOSIS — E890 Postprocedural hypothyroidism: Secondary | ICD-10-CM

## 2021-05-27 LAB — T4, FREE: Free T4: 0.69 ng/dL (ref 0.60–1.60)

## 2021-05-27 LAB — T3, FREE: T3, Free: 3.2 pg/mL (ref 2.3–4.2)

## 2021-05-27 LAB — TSH: TSH: 0.67 u[IU]/mL (ref 0.35–5.50)

## 2021-05-29 ENCOUNTER — Ambulatory Visit (INDEPENDENT_AMBULATORY_CARE_PROVIDER_SITE_OTHER): Payer: 59 | Admitting: Endocrinology

## 2021-05-29 ENCOUNTER — Encounter: Payer: Self-pay | Admitting: Endocrinology

## 2021-05-29 ENCOUNTER — Other Ambulatory Visit: Payer: Self-pay

## 2021-05-29 VITALS — BP 98/62 | HR 72 | Ht 63.75 in | Wt 119.6 lb

## 2021-05-29 DIAGNOSIS — E063 Autoimmune thyroiditis: Secondary | ICD-10-CM | POA: Diagnosis not present

## 2021-05-29 NOTE — Progress Notes (Signed)
Patient ID: [name], female   DOB: 05/05/1966, 55 y.o.   MRN: [id number]            Referring [place]  Reason for Appointment:  Hypothyroidism    History of Present Illness:   Hypothyroidism was first diagnosed  when she was about 55 years old  At the time of diagnosis patient had been treated with radioactive iodine for hyperthyroidism  She says that when she has hypothyroidism she will be having symptoms of  fatigue, cold sensitivity, some weight change and dry skin.      The patient has been treated with  initially levothyroxine and since about 2017 has been on Armour Thyroid from a nurse practitioner in [place]  With starting Armour Thyroid supplementation the patient's symptoms appeared to be better controlled She was told that her TSH was low but the dose had been continued for some time on 90 mg Armour Thyroid  Although records are not available she probably was reduced from 90 to 60 mg Armour Thyroid in 12/21 when she had a high T3 along with a low TSH level She said that for about 6 weeks after the change she actually felt less tired On her initial consultation she was having increasing fatigue, cold intolerance and weight gain  Has chronic dry skin  Because of her TSH being low her Armour Thyroid was reduced by half tablet twice a week on her consultation in June 22 With this she has had improvement in her fatigue, cold intolerance and also has no further weight gain  The patient takes the thyroid supplement about an hour before breakfast regularly          Patient's weight history is as follows:  Wt Readings from Last 3 Encounters:  05/29/21 119 lb 9.6 oz (54.3 kg)  04/04/21 119 lb 8 oz (54.2 kg)  06/03/18 116 lb (52.6 kg)    Thyroid function results have been as follows:  Lab Results  Component Value Date   TSH 0.67 05/27/2021   TSH 0.07 (L) 04/04/2021   FREET4 0.69 05/27/2021   FREET4 0.93 04/04/2021   T3FREE 3.2 05/27/2021   T3FREE  3.1 04/04/2021     Date   free T4   TSH   T3   12/12/2020 0.66  0.32  3.6  10/2020  0.78  <0.01  4.8  10/11/2019    0.7       Past Medical History:  Diagnosis Date   ASCUS of cervix with negative high risk HPV 06/2018   Breast cancer ([place]) 2010   Hypothyroidism    Migraine    Personal history of radiation therapy    2011 left breast   Urgency of urination     Past Surgical History:  Procedure Laterality Date   AUGMENTATION MAMMAPLASTY Bilateral    BREAST LUMPECTOMY Left 09/25/2009   BREAST SURGERY  2010   left   BREAST SURGERY  04/2015   CAPSULECTOMY Left 07/20/2014   Procedure: LEFT BREAST CAPSULECTOMY/REPLACE IMPLANT FOR RECONSTRUCTION OF LEFT BREAST;  Surgeon: Crissie Reese, MD;  Location: [facility];  Service: Plastics;  Laterality: Left;   DIAGNOSTIC LAPAROSCOPY  1996   Endometriosis    Family History  Problem Relation Age of Onset   Diabetes Mother    Hyperlipidemia Mother    Hyperlipidemia Father    Hypertension Father    Heart disease Father    Diabetes Maternal Grandmother    Thyroid disease Neg Hx     Social History:  reports that she has never smoked. She has never used smokeless tobacco. She reports that she does not drink alcohol and does not use drugs.  Allergies:  Allergies  Allergen Reactions   Topiramate Other (See Comments)    Headache    Allergies as of 05/29/2021       Reactions   Topiramate Other (See Comments)   Headache        Medication List        Accurate as of May 29, 2021  9:20 AM. If you have any questions, ask your nurse or doctor.          Armour Thyroid 90 MG tablet Generic drug: thyroid Take 90 mg by mouth daily.   Cholecalciferol 100 MCG (4000 UT) Caps Take 4,000 Units by mouth daily.   Emgality 120 MG/ML Sosy Generic drug: Galcanezumab-gnlm   Fish Oil 1000 MG Caps Take by mouth daily.   fluticasone 50 MCG/ACT nasal spray Commonly known as: FLONASE Place into the nose.           Review of Systems   Psychiatric/Behavioral:  Positive for insomnia.   She avoids milk because of lactose intolerance and also tries to avoid gluten in her diet  Low blood pressure: She will get little lightheadedness if she is moving quickly while exercising, tries to get up slowly out of bed in the morning Otherwise does not feel lightheaded, has no nausea, decreased appetite consistently or recent weight loss      BP Readings from Last 3 Encounters:  05/29/21 98/62  04/04/21 104/76  07/04/19 126/81            Examination:    BP 98/62   Pulse 72   Ht 5' 3.75" (1.619 m)   Wt 119 lb 9.6 oz (54.3 kg)   LMP 09/22/2012   SpO2 98%   BMI 20.69 kg/m      Assessment:  HYPOTHYROIDISM, post radioactive iodine  She has been on Armour Thyroid for the last few years which apparently has more effective than levothyroxine and relieving her symptoms of hypothyroidism She is still taking a relatively high dose of 90 mg, 6 tablets a week Subjectively doing well with reducing her dose on the last visit   PLAN:   She will continue the same dose of Armour Thyroid with half tablets on Sundays and Wednesdays She can take the supplement about 30 minutes before eating Follow-up in 6 months unless she has any new symptoms   Elayne Snare 05/29/2021, 9:20 AM     Note: This office note was prepared with Dragon voice recognition system technology. Any transcriptional errors that result from this process are unintentional.

## 2021-10-03 ENCOUNTER — Other Ambulatory Visit: Payer: Self-pay

## 2021-10-03 MED ORDER — ARMOUR THYROID 90 MG PO TABS: 90.0000 mg | ORAL_TABLET | Freq: Every day | ORAL | 2 refills | Status: DC

## 2021-10-11 DIAGNOSIS — D3131 Benign neoplasm of right choroid: Secondary | ICD-10-CM | POA: Diagnosis not present

## 2021-10-11 DIAGNOSIS — H2513 Age-related nuclear cataract, bilateral: Secondary | ICD-10-CM | POA: Diagnosis not present

## 2021-10-11 DIAGNOSIS — H16223 Keratoconjunctivitis sicca, not specified as Sjogren's, bilateral: Secondary | ICD-10-CM | POA: Diagnosis not present

## 2021-11-26 ENCOUNTER — Other Ambulatory Visit (INDEPENDENT_AMBULATORY_CARE_PROVIDER_SITE_OTHER): Payer: BC Managed Care – PPO

## 2021-11-26 ENCOUNTER — Other Ambulatory Visit: Payer: Self-pay

## 2021-11-26 DIAGNOSIS — E063 Autoimmune thyroiditis: Secondary | ICD-10-CM

## 2021-11-26 LAB — T4, FREE: Free T4: 0.6 ng/dL (ref 0.60–1.60)

## 2021-11-26 LAB — T3, FREE: T3, Free: 3.4 pg/mL (ref 2.3–4.2)

## 2021-11-26 LAB — TSH: TSH: 0.29 u[IU]/mL — ABNORMAL LOW (ref 0.35–5.50)

## 2021-11-29 ENCOUNTER — Encounter: Payer: Self-pay | Admitting: Endocrinology

## 2021-11-29 ENCOUNTER — Ambulatory Visit (INDEPENDENT_AMBULATORY_CARE_PROVIDER_SITE_OTHER): Payer: BC Managed Care – PPO | Admitting: Endocrinology

## 2021-11-29 ENCOUNTER — Other Ambulatory Visit: Payer: Self-pay

## 2021-11-29 VITALS — BP 110/76 | HR 68 | Ht 63.5 in | Wt 126.8 lb

## 2021-11-29 DIAGNOSIS — Z923 Personal history of irradiation: Secondary | ICD-10-CM | POA: Diagnosis not present

## 2021-11-29 DIAGNOSIS — E890 Postprocedural hypothyroidism: Secondary | ICD-10-CM

## 2021-11-29 NOTE — Progress Notes (Signed)
Patient ID: [name], female   DOB: Jul 18, 1966, 56 y.o.   MRN: [id number]             Reason for Appointment:  Hypothyroidism    History of Present Illness:   Hypothyroidism was first diagnosed  when she was about 55 years old  At the time of diagnosis patient had been treated with radioactive iodine for hyperthyroidism  She says that when she has hypothyroidism she will be having symptoms of  fatigue, cold sensitivity, some weight change and dry skin.      The patient has been treated with  initially levothyroxine and since about 2017 has been on Armour Thyroid from a nurse practitioner in [place]  With starting Armour Thyroid supplementation the patient's symptoms appeared to be better controlled The dose had been adjusted prior to her coming here  Recent history:  On her initial consultation she was having increasing fatigue, cold intolerance and weight gain  Because of her TSH being low her Armour Thyroid was reduced by half tablet twice a week on her consultation in June 22 With this she had improvement in her fatigue, cold intolerance by no change in her dry skin  She still continues to feel fairly good with her energy level, also has less insomnia No cold intolerance  She takes the Armour Thyroid daily about an hour before breakfast  She has been very regular with this  TSH is now slightly low without significant change in T4 and T3 levels, this was done 2 days after her half tablet dosage on Sunday         Patient's weight history is as follows:  Wt Readings from Last 3 Encounters:  11/29/21 126 lb 12.8 oz (57.5 kg)  05/29/21 119 lb 9.6 oz (54.3 kg)  04/04/21 119 lb 8 oz (54.2 kg)    Thyroid function results have been as follows:  Lab Results  Component Value Date   TSH 0.29 (L) 11/26/2021   TSH 0.67 05/27/2021   TSH 0.07 (L) 04/04/2021   FREET4 0.60 11/26/2021   FREET4 0.69 05/27/2021   FREET4 0.93 04/04/2021   T3FREE 3.4 11/26/2021   T3FREE  3.2 05/27/2021   T3FREE 3.1 04/04/2021     Date   free T4   TSH   T3   12/12/2020 0.66  0.32  3.6  10/2020  0.78  <0.01  4.8  10/11/2019    0.7       Past Medical History:  Diagnosis Date   ASCUS of cervix with negative high risk HPV 06/2018   Breast cancer ([place]) 2010   Hypothyroidism    Migraine    Personal history of radiation therapy    2011 left breast   Urgency of urination     Past Surgical History:  Procedure Laterality Date   AUGMENTATION MAMMAPLASTY Bilateral    BREAST LUMPECTOMY Left 09/25/2009   BREAST SURGERY  2010   left   BREAST SURGERY  04/2015   CAPSULECTOMY Left 07/20/2014   Procedure: LEFT BREAST CAPSULECTOMY/REPLACE IMPLANT FOR RECONSTRUCTION OF LEFT BREAST;  Surgeon: Crissie Reese, MD;  Location: [facility];  Service: Plastics;  Laterality: Left;   DIAGNOSTIC LAPAROSCOPY  1996   Endometriosis    Family History  Problem Relation Age of Onset   Diabetes Mother    Hyperlipidemia Mother    Hyperlipidemia Father    Hypertension Father    Heart disease Father    Diabetes Maternal Grandmother    Thyroid disease Neg Hx  Social History:  reports that she has never smoked. She has never used smokeless tobacco. She reports that she does not drink alcohol and does not use drugs.  Allergies:  Allergies  Allergen Reactions   Topiramate Other (See Comments)    Headache    Allergies as of 11/29/2021       Reactions   Topiramate Other (See Comments)   Headache        Medication List        Accurate as of November 29, 2021  8:29 AM. If you have any questions, ask your nurse or doctor.          Armour Thyroid 90 MG tablet Generic drug: thyroid Take 1 tablet (90 mg total) by mouth daily.   Cholecalciferol 100 MCG (4000 UT) Caps Take 4,000 Units by mouth daily.   Emgality 120 MG/ML Sosy Generic drug: Galcanezumab-gnlm   Fish Oil 1000 MG Caps Take by mouth daily.   fluticasone 50 MCG/ACT nasal spray Commonly known as: FLONASE Place into the  nose.           Review of Systems  She is now treating her lactose intolerance with Align and is able to tolerate milk products well She tries to avoid gluten in her diet   She will get little lightheadedness if she is moving quickly while exercising, tries to get up slowly out of bed in the morning       BP Readings from Last 3 Encounters:  11/29/21 110/76  05/29/21 98/62  04/04/21 104/76            Examination:    BP 110/76 (BP Location: Left Arm, Patient Position: Sitting, Cuff Size: Normal)    Pulse 68    Ht 5' 3.5" (1.613 m)    Wt 126 lb 12.8 oz (57.5 kg)    LMP 09/22/2012    SpO2 100%    BMI 22.11 kg/m      Assessment:  HYPOTHYROIDISM, post radioactive iodine  She has been on Armour Thyroid for the last few years which subjectively has helped her more than levothyroxine in relieving her symptoms of hypothyroidism For her weight of 58 kg she is taking a Relatively high dose of 90 mg, 6 tablets a week She has been regular with her supplement  She is not complaining of any unusual fatigue Her weight may have gone up with introducing more dairy products in her diet   PLAN:   She will continue the same dose of Armour Thyroid 90 mg tablets but take half tablets on Monday/Wednesday/Friday  She can take the supplement about 30 minutes before eating Follow-up in 5 months  She is going to be established with Dr. Dema Severin at [facility] and will get her bone density screening done through them  Elayne Snare 11/29/2021, 8:29 AM     Note: This office note was prepared with Dragon voice recognition system technology. Any transcriptional errors that result from this process are unintentional.

## 2021-11-29 NOTE — Patient Instructions (Signed)
1/2 pill 3x per week

## 2021-12-06 DIAGNOSIS — Q969 Turner's syndrome, unspecified: Secondary | ICD-10-CM | POA: Diagnosis not present

## 2021-12-06 DIAGNOSIS — N920 Excessive and frequent menstruation with regular cycle: Secondary | ICD-10-CM | POA: Diagnosis not present

## 2021-12-06 DIAGNOSIS — N921 Excessive and frequent menstruation with irregular cycle: Secondary | ICD-10-CM | POA: Diagnosis not present

## 2021-12-06 DIAGNOSIS — N946 Dysmenorrhea, unspecified: Secondary | ICD-10-CM | POA: Diagnosis not present

## 2022-01-24 DIAGNOSIS — M791 Myalgia, unspecified site: Secondary | ICD-10-CM | POA: Diagnosis not present

## 2022-01-24 DIAGNOSIS — M792 Neuralgia and neuritis, unspecified: Secondary | ICD-10-CM | POA: Diagnosis not present

## 2022-01-24 DIAGNOSIS — R5382 Chronic fatigue, unspecified: Secondary | ICD-10-CM | POA: Diagnosis not present

## 2022-01-24 DIAGNOSIS — R1084 Generalized abdominal pain: Secondary | ICD-10-CM | POA: Diagnosis not present

## 2022-01-24 DIAGNOSIS — E559 Vitamin D deficiency, unspecified: Secondary | ICD-10-CM | POA: Diagnosis not present

## 2022-01-24 DIAGNOSIS — M2550 Pain in unspecified joint: Secondary | ICD-10-CM | POA: Diagnosis not present

## 2022-01-25 LAB — LAB REPORT - SCANNED: EGFR: 97

## 2022-02-03 ENCOUNTER — Other Ambulatory Visit: Payer: Self-pay | Admitting: Plastic Surgery

## 2022-02-03 DIAGNOSIS — N644 Mastodynia: Secondary | ICD-10-CM

## 2022-02-04 ENCOUNTER — Other Ambulatory Visit: Payer: 59

## 2022-02-05 ENCOUNTER — Other Ambulatory Visit: Payer: BC Managed Care – PPO

## 2022-02-06 ENCOUNTER — Other Ambulatory Visit: Payer: 59

## 2022-02-24 DIAGNOSIS — Z0000 Encounter for general adult medical examination without abnormal findings: Secondary | ICD-10-CM | POA: Diagnosis not present

## 2022-02-24 DIAGNOSIS — Z1159 Encounter for screening for other viral diseases: Secondary | ICD-10-CM | POA: Diagnosis not present

## 2022-02-24 DIAGNOSIS — E039 Hypothyroidism, unspecified: Secondary | ICD-10-CM | POA: Diagnosis not present

## 2022-02-24 DIAGNOSIS — Z23 Encounter for immunization: Secondary | ICD-10-CM | POA: Diagnosis not present

## 2022-02-24 DIAGNOSIS — M2550 Pain in unspecified joint: Secondary | ICD-10-CM | POA: Diagnosis not present

## 2022-02-24 DIAGNOSIS — F338 Other recurrent depressive disorders: Secondary | ICD-10-CM | POA: Diagnosis not present

## 2022-02-24 DIAGNOSIS — Z124 Encounter for screening for malignant neoplasm of cervix: Secondary | ICD-10-CM | POA: Diagnosis not present

## 2022-02-24 DIAGNOSIS — G43909 Migraine, unspecified, not intractable, without status migrainosus: Secondary | ICD-10-CM | POA: Diagnosis not present

## 2022-02-28 ENCOUNTER — Ambulatory Visit: Admission: RE | Admit: 2022-02-28 | Payer: BC Managed Care – PPO | Source: Ambulatory Visit

## 2022-02-28 ENCOUNTER — Ambulatory Visit: Payer: BC Managed Care – PPO

## 2022-02-28 ENCOUNTER — Ambulatory Visit
Admission: RE | Admit: 2022-02-28 | Discharge: 2022-02-28 | Disposition: A | Discharge status: Home / Self Care | Payer: BC Managed Care – PPO | Source: Ambulatory Visit | Attending: Plastic Surgery | Admitting: Plastic Surgery

## 2022-02-28 ENCOUNTER — Other Ambulatory Visit: Payer: Self-pay | Admitting: Plastic Surgery

## 2022-02-28 DIAGNOSIS — N644 Mastodynia: Secondary | ICD-10-CM | POA: Diagnosis not present

## 2022-03-18 DIAGNOSIS — S6992XA Unspecified injury of left wrist, hand and finger(s), initial encounter: Secondary | ICD-10-CM | POA: Diagnosis not present

## 2022-03-18 DIAGNOSIS — S61203A Unspecified open wound of left middle finger without damage to nail, initial encounter: Secondary | ICD-10-CM | POA: Diagnosis not present

## 2022-03-21 DIAGNOSIS — N920 Excessive and frequent menstruation with regular cycle: Secondary | ICD-10-CM | POA: Diagnosis not present

## 2022-03-21 DIAGNOSIS — N9489 Other specified conditions associated with female genital organs and menstrual cycle: Secondary | ICD-10-CM | POA: Diagnosis not present

## 2022-03-21 DIAGNOSIS — Z3041 Encounter for surveillance of contraceptive pills: Secondary | ICD-10-CM | POA: Diagnosis not present

## 2022-03-27 DIAGNOSIS — N644 Mastodynia: Secondary | ICD-10-CM | POA: Diagnosis not present

## 2022-03-27 DIAGNOSIS — Z9889 Other specified postprocedural states: Secondary | ICD-10-CM | POA: Diagnosis not present

## 2022-03-27 DIAGNOSIS — Z853 Personal history of malignant neoplasm of breast: Secondary | ICD-10-CM | POA: Diagnosis not present

## 2022-04-07 DIAGNOSIS — F4322 Adjustment disorder with anxiety: Secondary | ICD-10-CM | POA: Diagnosis not present

## 2022-04-08 ENCOUNTER — Other Ambulatory Visit (INDEPENDENT_AMBULATORY_CARE_PROVIDER_SITE_OTHER): Payer: Self-pay

## 2022-04-08 DIAGNOSIS — E890 Postprocedural hypothyroidism: Secondary | ICD-10-CM

## 2022-04-08 LAB — T4, FREE: Free T4: 0.65 ng/dL (ref 0.60–1.60)

## 2022-04-08 LAB — T3, FREE: T3, Free: 4.1 pg/mL (ref 2.3–4.2)

## 2022-04-08 LAB — TSH: TSH: 5.74 u[IU]/mL — ABNORMAL HIGH (ref 0.35–5.50)

## 2022-04-10 ENCOUNTER — Encounter: Payer: BC Managed Care – PPO | Admitting: Endocrinology

## 2022-04-15 ENCOUNTER — Ambulatory Visit: Payer: BC Managed Care – PPO | Admitting: Endocrinology

## 2022-04-16 DIAGNOSIS — F4322 Adjustment disorder with anxiety: Secondary | ICD-10-CM | POA: Diagnosis not present

## 2022-04-17 ENCOUNTER — Encounter: Payer: Self-pay | Admitting: Endocrinology

## 2022-04-17 ENCOUNTER — Ambulatory Visit (INDEPENDENT_AMBULATORY_CARE_PROVIDER_SITE_OTHER): Payer: Self-pay | Admitting: Endocrinology

## 2022-04-17 VITALS — BP 110/70 | HR 67 | Ht 63.75 in | Wt 125.2 lb

## 2022-04-17 DIAGNOSIS — E890 Postprocedural hypothyroidism: Secondary | ICD-10-CM

## 2022-04-17 NOTE — Progress Notes (Signed)
Patient ID: [name], female   DOB: 20-Oct-1966, 56 y.o.   MRN: [id number]             Reason for Appointment:  Hypothyroidism    History of Present Illness:   Hypothyroidism was first diagnosed  when she was about 56 years old  At the time of diagnosis patient had been treated with radioactive iodine for hyperthyroidism  She says that when she has hypothyroidism she will be having symptoms of  fatigue, cold sensitivity, some weight change and dry skin.      The patient has been treated with  initially levothyroxine and since about 2017 has been on Armour Thyroid from a nurse practitioner in [place]  With starting Armour Thyroid supplementation the patient's symptoms appeared to be better controlled The dose had been adjusted prior to her coming here  Recent history:  On her initial consultation she was having increasing fatigue, cold intolerance and weight gain  Because of her TSH being low her Armour Thyroid was reduced by half tablet twice a week on her consultation in June 22 With this she had improvement in her fatigue, cold intolerance by no change in her dry skin  Currently taking Armour Thyroid 90 mg, half tablet on 3 days and full tablet in 4 days a week She has some cold intolerance  She takes the Armour Thyroid daily about an hour before breakfast  She has not missed any doses lately   TSH is now unusually high at 5.7 compared to 0.29 previously         Patient's weight history is as follows:  Wt Readings from Last 3 Encounters:  04/17/22 125 lb 3.2 oz (56.8 kg)  11/29/21 126 lb 12.8 oz (57.5 kg)  05/29/21 119 lb 9.6 oz (54.3 kg)    Thyroid function results have been as follows:  Lab Results  Component Value Date   TSH 5.74 (H) 04/08/2022   TSH 0.29 (L) 11/26/2021   TSH 0.67 05/27/2021   TSH 0.07 (L) 04/04/2021   FREET4 0.65 04/08/2022   FREET4 0.60 11/26/2021   FREET4 0.69 05/27/2021   FREET4 0.93 04/04/2021   T3FREE 4.1 04/08/2022    T3FREE 3.4 11/26/2021   T3FREE 3.2 05/27/2021     Date   free T4   TSH   T3   12/12/2020 0.66  0.32  3.6  10/2020  0.78  <0.01  4.8  10/11/2019    0.7       Past Medical History:  Diagnosis Date   ASCUS of cervix with negative high risk HPV 06/2018   Breast cancer ([facility]) 2010   Hypothyroidism    Migraine    Personal history of radiation therapy    2011 left breast   Urgency of urination     Past Surgical History:  Procedure Laterality Date   AUGMENTATION MAMMAPLASTY Bilateral    BREAST LUMPECTOMY Left 09/25/2009   BREAST SURGERY  2010   left   BREAST SURGERY  04/2015   CAPSULECTOMY Left 07/20/2014   Procedure: LEFT BREAST CAPSULECTOMY/REPLACE IMPLANT FOR RECONSTRUCTION OF LEFT BREAST;  Surgeon: Crissie Reese, MD;  Location: [facility];  Service: Plastics;  Laterality: Left;   DIAGNOSTIC LAPAROSCOPY  1996   Endometriosis    Family History  Problem Relation Age of Onset   Diabetes Mother    Hyperlipidemia Mother    Hyperlipidemia Father    Hypertension Father    Heart disease Father    Diabetes Maternal Grandmother  Thyroid disease Neg Hx     Social History:  reports that she has never smoked. She has never used smokeless tobacco. She reports that she does not drink alcohol and does not use drugs.  Allergies:  Allergies  Allergen Reactions   Topiramate Other (See Comments)    Headache    Allergies as of 04/17/2022       Reactions   Topiramate Other (See Comments)   Headache        Medication List        Accurate as of April 17, 2022  4:47 PM. If you have any questions, ask your nurse or doctor.          Armour Thyroid 90 MG tablet Generic drug: thyroid Take 1 tablet (90 mg total) by mouth daily.   bifidobacterium infantis capsule See admin instructions.   Cholecalciferol 100 MCG (4000 UT) Caps Take 4,000 Units by mouth daily.   Emgality 120 MG/ML Sosy Generic drug: Galcanezumab-gnlm   Fish Oil 1000 MG Caps Take by mouth daily.   fluticasone  50 MCG/ACT nasal spray Commonly known as: FLONASE Place into the nose.   omeprazole 20 MG capsule Commonly known as: PRILOSEC 1 capsule 30 minutes before morning meal   promethazine 25 MG tablet Commonly known as: PHENERGAN 1 tablet   Turmeric 500 MG Caps 3 capsules   Vitamin B1 100 MG Tabs 2.5 tablet           Review of Systems  Tends to have low normal blood pressure readings at times       BP Readings from Last 3 Encounters:  04/17/22 110/70  11/29/21 110/76  05/29/21 98/62   Just starting to take Prilosec for GI symptoms        Examination:    BP 110/70   Pulse 67   Ht 5' 3.75" (1.619 m)   Wt 125 lb 3.2 oz (56.8 kg)   LMP 09/22/2012   SpO2 96%   BMI 21.66 kg/m      Assessment:  HYPOTHYROIDISM, post radioactive iodine  She has been on Armour Thyroid for the last few years which subjectively has helped her more than levothyroxine in relieving her symptoms of hypothyroidism Currently taking 90 mg 4 days a week and 45 mg 3 days a week She has been regular with her regimen  She may have fatigue at times but she is not sure how often May have a little cold intolerance  TSH is higher than usual at 5.7  PLAN:   She will continue the same prescription of Armour Thyroid 90 mg tablets but take half tablet only once a week on Sunday which will add 1 full tablet per week to her regimen She will take her Armour Thyroid at least 30 minutes before breakfast and can take a Prilosec right before eating  Follow-up in 4 months  Shellene Sweigert 04/17/2022, 4:47 PM     Note: This office note was prepared with Dragon voice recognition system technology. Any transcriptional errors that result from this process are unintentional.

## 2022-04-17 NOTE — Patient Instructions (Signed)
Take 1/2 tab only 1x per week

## 2022-05-12 ENCOUNTER — Other Ambulatory Visit: Payer: Self-pay | Admitting: Gastroenterology

## 2022-05-12 DIAGNOSIS — R140 Abdominal distension (gaseous): Secondary | ICD-10-CM | POA: Diagnosis not present

## 2022-05-12 DIAGNOSIS — R1032 Left lower quadrant pain: Secondary | ICD-10-CM

## 2022-05-12 DIAGNOSIS — R109 Unspecified abdominal pain: Secondary | ICD-10-CM

## 2022-05-12 DIAGNOSIS — K219 Gastro-esophageal reflux disease without esophagitis: Secondary | ICD-10-CM | POA: Diagnosis not present

## 2022-05-16 ENCOUNTER — Ambulatory Visit (INDEPENDENT_AMBULATORY_CARE_PROVIDER_SITE_OTHER): Payer: BC Managed Care – PPO | Admitting: Nurse Practitioner

## 2022-05-16 ENCOUNTER — Other Ambulatory Visit (HOSPITAL_COMMUNITY)
Admission: RE | Admit: 2022-05-16 | Discharge: 2022-05-16 | Disposition: A | Discharge status: Home / Self Care | Payer: BC Managed Care – PPO | Source: Ambulatory Visit | Attending: Nurse Practitioner | Admitting: Nurse Practitioner

## 2022-05-16 ENCOUNTER — Encounter: Payer: Self-pay | Admitting: Nurse Practitioner

## 2022-05-16 VITALS — BP 112/70 | Ht 63.0 in | Wt 125.0 lb

## 2022-05-16 DIAGNOSIS — Z780 Asymptomatic menopausal state: Secondary | ICD-10-CM | POA: Diagnosis not present

## 2022-05-16 DIAGNOSIS — N941 Unspecified dyspareunia: Secondary | ICD-10-CM

## 2022-05-16 DIAGNOSIS — N644 Mastodynia: Secondary | ICD-10-CM

## 2022-05-16 DIAGNOSIS — Z01419 Encounter for gynecological examination (general) (routine) without abnormal findings: Secondary | ICD-10-CM | POA: Diagnosis not present

## 2022-05-16 DIAGNOSIS — N6320 Unspecified lump in the left breast, unspecified quadrant: Secondary | ICD-10-CM

## 2022-05-16 NOTE — Progress Notes (Signed)
[name] [birth year] [id number]   History:  56 y.o. G2P2 presents to re-establish care. Last seen in the office in 2019. Reports left breast tenderness and lumpiness. This is not new for her. Breast MRI 03/27/2022 was negative. Also complains of painful intercourse, has not been sexually active for 5 years due to vaginal dryness with Tamoxifen. Tried vaginal estrogen shortly with oncology approval and had headaches. Postmenopausal - no HRT, no bleeding. 2019 ASCUS neg HR HPV, otherwise normal history. 2010 left breast cancer ER/PR+ managed with lumpectomy, radiation, and anti-estrogen therapy x 2 years. 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   Gynecologic History Patient's last menstrual period was 09/22/2012.   Contraception/Family planning: post menopausal status Sexually active: Yes  Health Maintenance Last Pap: 06/03/2018. Results were: ASCUS neg HR HPV Last mammogram (diagnostic): 02/28/2022. Results were: Normal Last colonoscopy: 2021. Results were: Normal, 10-year recall Last Dexa: Never  Past medical history, past surgical history, family history and social history were all reviewed and documented in the EPIC chart. Married. SAHM, used to be pharmacist. 2 biological sons, one in [place], one in [place]. Adopted daughter, junior in [place].   ROS:  A ROS was performed and pertinent positives and negatives are included.  Exam:  Vitals:   05/16/22 1340  BP: 112/70  Weight: 125 lb (56.7 kg)  Height: '5\' 3"'$  (1.6 m)   Body mass index is 22.14 kg/m.  General appearance:  Normal Thyroid:  Symmetrical, normal in size, without palpable masses or nodularity. Respiratory  Auscultation:  Clear without wheezing or rhonchi Cardiovascular  Auscultation:  Regular rate, without rubs, murmurs or gallops  Edema/varicosities:  Not grossly evident Abdominal  Soft,nontender, without masses, guarding or rebound.  Liver/spleen:  No organomegaly noted  Hernia:  None  appreciated  Skin  Inspection:  Grossly normal Breasts: Examined lying and sitting. Bilateral implants nodes  Right: Without masses, retractions, nipple discharge or axillary adenopathy.   Left: Without masses, retractions, nipple discharge or axillary adenopathy. Fibrocystic breast tissue felt along inner region where pain is felt Genitourinary   Inguinal/mons:  Normal without inguinal adenopathy  External genitalia:  Normal appearing vulva with no masses, tenderness, or lesions  BUS/Urethra/Skene's glands:  Normal  Vagina:  Normal appearing with normal color and discharge, no lesions. Atrophic changes  Cervix:  Normal appearing without discharge or lesions  Uterus:  Normal in size, shape and contour.  Midline and mobile, nontender  Adnexa/parametria:     Rt: Normal in size, without masses or tenderness.   Lt: Normal in size, without masses or tenderness.  Anus and perineum: Normal  Digital rectal exam: Normal sphincter tone without palpated masses or tenderness  Patient informed chaperone available to be present for breast and pelvic exam. Patient has requested no chaperone to be present. Patient has been advised what will be completed during breast and pelvic exam.   Assessment/Plan:  56 y.o. G2P2 to re-establish care.   Well female exam with routine gynecological exam - Plan: Cytology - PAP( [facility]). Education provided on SBEs, importance of preventative screenings, current guidelines, high calcium diet, regular exercise, and multivitamin daily.  Labs done with PCP and endocrinology.   Postmenopausal - no HRT, no bleeding  Dyspareunia in female - Also complains of painful intercourse, has not been sexually active for 5 years due to vaginal dryness with Tamoxifen. Tried vaginal estrogen shortly with oncology approval and had headaches. Recommend oil or silicone-based lubricant. May need to use vaginal dilators.   Painful lumpy left breast -  Reports left breast tenderness and  lumpiness. This is not new for her. Breast MRI 03/27/2022 was negative. Fibrocystic tissue felt on exam in inner region where pain is felt. Continue to monitor.   Screening for cervical cancer - 2018 ASCUS neg HR HPV, otherwise normal history. Pap today.   Screening for colon cancer - 2021 colonoscopy. Will repeat at 10-year interval per GI's recommendation.   Screening for osteoporosis - Recommend starting bone density screenings early due to h/o Tamoxifen use.   Return in 1 year for annual.     Jamie Gammon DNP, 1:59 PM 05/16/2022

## 2022-05-19 ENCOUNTER — Ambulatory Visit
Admission: RE | Admit: 2022-05-19 | Discharge: 2022-05-19 | Disposition: A | Discharge status: Home / Self Care | Payer: Self-pay | Source: Ambulatory Visit | Attending: Gastroenterology | Admitting: Gastroenterology

## 2022-05-19 DIAGNOSIS — R1032 Left lower quadrant pain: Secondary | ICD-10-CM | POA: Diagnosis not present

## 2022-05-19 DIAGNOSIS — R140 Abdominal distension (gaseous): Secondary | ICD-10-CM

## 2022-05-19 DIAGNOSIS — R109 Unspecified abdominal pain: Secondary | ICD-10-CM | POA: Diagnosis not present

## 2022-05-21 LAB — CYTOLOGY - PAP
Comment: NEGATIVE
High risk HPV: NEGATIVE

## 2022-05-25 ENCOUNTER — Encounter: Payer: Self-pay | Admitting: Nurse Practitioner

## 2022-05-26 NOTE — Telephone Encounter (Signed)
Responded via mychart results.

## 2022-06-06 DIAGNOSIS — J3489 Other specified disorders of nose and nasal sinuses: Secondary | ICD-10-CM | POA: Diagnosis not present

## 2022-06-18 DIAGNOSIS — H16223 Keratoconjunctivitis sicca, not specified as Sjogren's, bilateral: Secondary | ICD-10-CM | POA: Diagnosis not present

## 2022-08-08 NOTE — Progress Notes (Deleted)
Office Visit Note  Patient: [name]             Date of Birth: [birth year]           MRN: [id number]             PCP: Harlan Stains, MD Referring: Almedia Balls, NP Visit Date: 08/22/2022 Occupation: _0 @  Subjective:  No chief complaint on file.   History of Present Illness: [name] is a 56 y.o. female ***   Activities of Daily Living:  Patient reports morning stiffness for *** {minute/hour:19697}.   Patient {ACTIONS;DENIES/REPORTS:21021675::"Denies"} nocturnal pain.  Difficulty dressing/grooming: {ACTIONS;DENIES/REPORTS:21021675::"Denies"} Difficulty climbing stairs: {ACTIONS;DENIES/REPORTS:21021675::"Denies"} Difficulty getting out of chair: {ACTIONS;DENIES/REPORTS:21021675::"Denies"} Difficulty using hands for taps, buttons, cutlery, and/or writing: {ACTIONS;DENIES/REPORTS:21021675::"Denies"}  No Rheumatology ROS completed.   PMFS History:  Patient Active Problem List   Diagnosis Date Noted  . Sleep apnea 12/19/2019  . Migraine without aura and without status migrainosus, not intractable 12/09/2018  . 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  . Deviated septum 01/05/2018  . Nasal deformity 01/05/2018  . Nasal obstruction 01/05/2018  . Headache around the eyes 10/06/2017  . Chronic rhinitis 10/06/2017  . Breast cancer of upper-inner quadrant of left female breast ([place]) 12/02/2013    Past Medical History:  Diagnosis Date  . ASCUS of cervix with negative high risk HPV 06/2018  . Breast cancer ([place]) 2010  . Hypothyroidism   . Migraine   . Personal history of radiation therapy    2011 left breast  . Urgency of urination     Family History  Problem Relation Age of Onset  . Diabetes Mother   . Hyperlipidemia Mother   . Hyperlipidemia Father   . Hypertension Father   . Heart disease Father   . Heart failure Father   . Other Father        fructose intolerant  . Diabetes Maternal Grandmother   . Thyroid disease Neg Hx    Past Surgical History:   Procedure Laterality Date  . AUGMENTATION MAMMAPLASTY Bilateral   . BREAST LUMPECTOMY Left 09/25/2009  . BREAST SURGERY  2010   left  . BREAST SURGERY  04/2015  . CAPSULECTOMY Left 07/20/2014   Procedure: LEFT BREAST CAPSULECTOMY/REPLACE IMPLANT FOR RECONSTRUCTION OF LEFT BREAST;  Surgeon: Crissie Reese, MD;  Location: [facility];  Service: Plastics;  Laterality: Left;  . DIAGNOSTIC LAPAROSCOPY  1996   Endometriosis   Social History   Social History Narrative  . Not on file   Immunization History  Administered Date(s) Administered  . Influenza,inj,Quad PF,6+ Mos 07/19/2019  . PFIZER(Purple Top)SARS-COV-2 Vaccination 02/02/2020, 02/27/2020     Objective: Vital Signs: LMP 09/22/2012    Physical Exam   Musculoskeletal Exam: ***  CDAI Exam: CDAI Score: -- Patient Global: --; Provider Global: -- Swollen: --; Tender: -- Joint Exam 08/22/2022   No joint exam has been documented for this visit   There is currently no information documented on the homunculus. Go to the Rheumatology activity and complete the homunculus joint exam.  Investigation: No additional findings.  Imaging: No results found.  Recent Labs: Lab Results  Component Value Date   WBC 4.3 05/22/2015   HGB 11.8 05/22/2015   PLT 155 05/22/2015   NA 141 05/22/2015   K 4.3 05/22/2015   CL 104 05/26/2014   CO2 30 (H) 05/22/2015   GLUCOSE 94 05/22/2015   BUN 15.9 05/22/2015   CREATININE 0.8 05/22/2015   BILITOT 0.26 05/22/2015   ALKPHOS 48 05/22/2015   AST  17 05/22/2015   ALT 12 05/22/2015   PROT 6.5 05/22/2015   ALBUMIN 3.5 05/22/2015   CALCIUM 9.0 05/22/2015   GFRAA >90 05/26/2014    Speciality Comments: No specialty comments available.  Procedures:  No procedures performed Allergies: Topiramate and Galcanezumab-gnlm   Assessment / Plan:     Visit Diagnoses: Polyarthralgia - 01/24/22: RF<10, ESR 10, ANA negative, CRP<1, Ro-, La-, Vitamin D 51.6, Vitamin B12 408, Mg 2.5, 10/11/20:TSH 0.32, Free T4  0.66, T3 free 3.58  Chronic fatigue  Swelling of lymph node  History of hypothyroidism  Plantar fasciitis of left foot  Vitamin D deficiency  History of gastroesophageal reflux (GERD)  OAB (overactive bladder)  Hx of migraines  History of anxiety  History of sleep apnea  Malignant neoplasm of upper-inner quadrant of left breast in female, estrogen receptor positive ([place])  Orders: No orders of the defined types were placed in this encounter.  No orders of the defined types were placed in this encounter.   Face-to-face time spent with patient was *** minutes. Greater than 50% of time was spent in counseling and coordination of care.  Follow-Up Instructions: No follow-ups on file.   Ofilia Neas, PA-C  Note - This record has been created using Dragon software.  Chart creation errors have been sought, but may not always  have been located. Such creation errors do not reflect on  the standard of medical care.

## 2022-08-21 ENCOUNTER — Other Ambulatory Visit (INDEPENDENT_AMBULATORY_CARE_PROVIDER_SITE_OTHER): Payer: BC Managed Care – PPO

## 2022-08-21 DIAGNOSIS — E890 Postprocedural hypothyroidism: Secondary | ICD-10-CM | POA: Diagnosis not present

## 2022-08-21 LAB — TSH: TSH: 0.4 u[IU]/mL (ref 0.35–5.50)

## 2022-08-21 LAB — T4, FREE: Free T4: 0.54 ng/dL — ABNORMAL LOW (ref 0.60–1.60)

## 2022-08-22 ENCOUNTER — Ambulatory Visit: Payer: BC Managed Care – PPO | Admitting: Rheumatology

## 2022-08-22 DIAGNOSIS — Z8659 Personal history of other mental and behavioral disorders: Secondary | ICD-10-CM

## 2022-08-22 DIAGNOSIS — E559 Vitamin D deficiency, unspecified: Secondary | ICD-10-CM

## 2022-08-22 DIAGNOSIS — Z170 Estrogen receptor positive status [ER+]: Secondary | ICD-10-CM

## 2022-08-22 DIAGNOSIS — Z8669 Personal history of other diseases of the nervous system and sense organs: Secondary | ICD-10-CM

## 2022-08-22 DIAGNOSIS — Z8719 Personal history of other diseases of the digestive system: Secondary | ICD-10-CM

## 2022-08-22 DIAGNOSIS — N3281 Overactive bladder: Secondary | ICD-10-CM

## 2022-08-22 DIAGNOSIS — R5382 Chronic fatigue, unspecified: Secondary | ICD-10-CM

## 2022-08-22 DIAGNOSIS — M2550 Pain in unspecified joint: Secondary | ICD-10-CM

## 2022-08-22 DIAGNOSIS — Z8639 Personal history of other endocrine, nutritional and metabolic disease: Secondary | ICD-10-CM

## 2022-08-22 DIAGNOSIS — M722 Plantar fascial fibromatosis: Secondary | ICD-10-CM

## 2022-08-22 DIAGNOSIS — R599 Enlarged lymph nodes, unspecified: Secondary | ICD-10-CM

## 2022-08-27 NOTE — Progress Notes (Unsigned)
Patient ID: [name], female   DOB: [birth year], 56 y.o.   MRN: [id number]             Reason for Appointment:  Hypothyroidism    History of Present Illness:   Hypothyroidism was first diagnosed  when she was about 56 years old  At the time of diagnosis patient had been treated with radioactive iodine for hyperthyroidism  She says that when she has hypothyroidism she will be having symptoms of  fatigue, cold sensitivity, some weight change and dry skin.      The patient has been treated with  initially levothyroxine and since about 2017 has been on Armour Thyroid from a nurse practitioner in [place]  With starting Armour Thyroid supplementation the patient's symptoms appeared to be better controlled The dose had been adjusted prior to her coming here  Recent history:  On her initial consultation she was having increasing fatigue, cold intolerance and weight gain  Because of her TSH being low her Armour Thyroid was reduced by half tablet twice a week on her consultation in June 22 With this she had improvement in her fatigue, cold intolerance by no change in her dry skin  Currently taking Armour Thyroid 90 mg, half tablet on Wednesdays  She takes the Armour Thyroid daily about an hour before breakfast  She has not missed any doses   TSH is back to normal at 0.4 However free T4 is slightly low, usually low normal         Patient's weight history is as follows:  Wt Readings from Last 3 Encounters:  08/28/22 127 lb 11 oz (57.9 kg)  05/16/22 125 lb (56.7 kg)  04/17/22 125 lb 3.2 oz (56.8 kg)    Thyroid function results have been as follows:  Lab Results  Component Value Date   TSH 0.40 08/21/2022   TSH 5.74 (H) 04/08/2022   TSH 0.29 (L) 11/26/2021   TSH 0.67 05/27/2021   FREET4 0.54 (L) 08/21/2022   FREET4 0.65 04/08/2022   FREET4 0.60 11/26/2021   FREET4 0.69 05/27/2021   T3FREE 4.1 04/08/2022   T3FREE 3.4 11/26/2021   T3FREE 3.2 05/27/2021     Date    free T4   TSH   T3   12/12/2020 0.66  0.32  3.6  10/2020  0.78  <0.01  4.8  10/11/2019    0.7       Past Medical History:  Diagnosis Date   ASCUS of cervix with negative high risk HPV 06/2018   Breast cancer ([place]) 2010   Hypothyroidism    Migraine    Personal history of radiation therapy    2011 left breast   Urgency of urination     Past Surgical History:  Procedure Laterality Date   AUGMENTATION MAMMAPLASTY Bilateral    BREAST LUMPECTOMY Left 09/25/2009   BREAST SURGERY  2010   left   BREAST SURGERY  04/2015   CAPSULECTOMY Left 07/20/2014   Procedure: LEFT BREAST CAPSULECTOMY/REPLACE IMPLANT FOR RECONSTRUCTION OF LEFT BREAST;  Surgeon: Crissie Reese, MD;  Location: [facility];  Service: Plastics;  Laterality: Left;   DIAGNOSTIC LAPAROSCOPY  1996   Endometriosis    Family History  Problem Relation Age of Onset   Diabetes Mother    Hyperlipidemia Mother    Hyperlipidemia Father    Hypertension Father    Heart disease Father    Heart failure Father    Other Father        fructose intolerant  Diabetes Maternal Grandmother    Thyroid disease Neg Hx     Social History:  reports that she has never smoked. She has never used smokeless tobacco. She reports that she does not drink alcohol and does not use drugs.  Allergies:  Allergies  Allergen Reactions   Topiramate Other (See Comments)    Headache   Galcanezumab-Gnlm Other (See Comments)    Allergies as of 08/28/2022       Reactions   Topiramate Other (See Comments)   Headache   Galcanezumab-gnlm Other (See Comments)        Medication List        Accurate as of August 28, 2022 11:34 AM. If you have any questions, ask your nurse or doctor.          Allegra Allergy 180 MG tablet Generic drug: fexofenadine Take 180 mg by mouth daily.   Armour Thyroid 90 MG tablet Generic drug: thyroid Take 1 tablet (90 mg total) by mouth daily.   BENADRYL PO Take by mouth.   bifidobacterium infantis capsule See  admin instructions.   Cholecalciferol 100 MCG (4000 UT) Caps Take 4,000 Units by mouth daily.   Emgality 120 MG/ML Sosy Generic drug: Galcanezumab-gnlm   Fish Oil 1000 MG Caps Take by mouth daily.   fluticasone 50 MCG/ACT nasal spray Commonly known as: FLONASE Place into the nose.   Magnesium Oxide 250 MG Tabs 1 tablet as needed   omeprazole 20 MG capsule Commonly known as: PRILOSEC 1 capsule 30 minutes before morning meal   promethazine 25 MG tablet Commonly known as: PHENERGAN 1 tablet   rizatriptan 10 MG disintegrating tablet Commonly known as: MAXALT-MLT SMARTSIG:1 Tablet(s) By Mouth 1-2 Times Daily   thiamine 100 MG tablet Commonly known as: VITAMIN B1 2.5 tablet   Turmeric 500 MG Caps 3 capsules   VITAMIN B2 PO Take by mouth.           Review of Systems  Tends to have low normal blood pressure readings at times       BP Readings from Last 3 Encounters:  08/28/22 110/78  05/16/22 112/70  04/17/22 110/70   take Prilosec for GI symptoms        Examination:    BP 110/78   Pulse 68   Ht 5' 3.75" (1.619 m)   Wt 127 lb 11 oz (57.9 kg)   LMP 09/22/2012   SpO2 99%   BMI 22.09 kg/m   Thyroid not palpable Biceps flexes difficult to elicit   Assessment:  HYPOTHYROIDISM, post radioactive iodine  She has been on Armour Thyroid for the last few years which subjectively has helped her more than levothyroxine in relieving her symptoms of hypothyroidism Currently taking 90 mg 6-1/2 tablets a week  TSH is back to normal with adding another 90 mg Armour Thyroid weekly She has less fatigue also   PLAN:   She will continue the same prescription of Armour Thyroid 90 mg tablets with half tablet on Wednesdays Call if starting to have unusual fatigue again If free T3 is high on the next visit may consider switching to LT4 and T3 separately  Follow-up in 4 months  Grant Henkes 08/28/2022, 11:34 AM     Note: This office note was prepared with  Dragon voice recognition system technology. Any transcriptional errors that result from this process are unintentional.

## 2022-08-28 ENCOUNTER — Ambulatory Visit (INDEPENDENT_AMBULATORY_CARE_PROVIDER_SITE_OTHER): Payer: BC Managed Care – PPO | Admitting: Endocrinology

## 2022-08-28 ENCOUNTER — Encounter: Payer: Self-pay | Admitting: Endocrinology

## 2022-08-28 VITALS — BP 110/78 | HR 68 | Ht 63.75 in | Wt 127.7 lb

## 2022-08-28 DIAGNOSIS — E890 Postprocedural hypothyroidism: Secondary | ICD-10-CM | POA: Diagnosis not present

## 2022-09-18 ENCOUNTER — Ambulatory Visit: Payer: BC Managed Care – PPO | Admitting: Rheumatology

## 2022-09-30 DIAGNOSIS — K219 Gastro-esophageal reflux disease without esophagitis: Secondary | ICD-10-CM | POA: Diagnosis not present

## 2022-09-30 DIAGNOSIS — R1013 Epigastric pain: Secondary | ICD-10-CM | POA: Diagnosis not present

## 2022-10-16 ENCOUNTER — Other Ambulatory Visit (HOSPITAL_COMMUNITY): Payer: Self-pay

## 2022-10-16 ENCOUNTER — Ambulatory Visit (INDEPENDENT_AMBULATORY_CARE_PROVIDER_SITE_OTHER): Payer: BC Managed Care – PPO | Admitting: Psychiatry

## 2022-10-16 ENCOUNTER — Telehealth: Payer: Self-pay | Admitting: Neurology

## 2022-10-16 VITALS — BP 125/83 | HR 71 | Ht 63.0 in | Wt 128.6 lb

## 2022-10-16 DIAGNOSIS — G43009 Migraine without aura, not intractable, without status migrainosus: Secondary | ICD-10-CM

## 2022-10-16 MED ORDER — UBRELVY 100 MG PO TABS
100.0000 mg | ORAL_TABLET | ORAL | 6 refills | Status: DC | PRN
Start: 2022-10-16 — End: 2023-01-12

## 2022-10-16 NOTE — Progress Notes (Signed)
Referring:  Harlan Stains, MD [address],  [place] [address]  PCP: Harlan Stains, MD  Neurology was asked to evaluate [name], a 56 year old female for a chief complaint of headaches.  Our recommendations of care will be communicated by shared medical record.    CC:  headaches  History provided from self  HPI:  Medical co-morbidities: OSA, breast cancer, hypothyroidism  The patient presents for evaluation of headaches which began in college. Headaches have been getting more frequent over time. Migraines are associated with photophobia, phonophobia, and nausea. She can go one week without headaches, but then will develop a migraine which lasts 5-7 days at a time. She is currently averaging ~15 migraine days per month.  She takes Maxalt as needed. This helps but she tries to avoid taking it as it makes her drowsy. She also has taken [place] which helps, but has an expensive copay.  Headache History: Onset: college Aura: no Location: unilateral (either side) Associated Symptoms:  Photophobia: yes  Phonophobia: yes  Nausea: yes Worse with activity?: yes Duration of headaches: 5-7 days  Migraine days per month: 15 Headache free days per month: 15  Current Treatment: Abortive Tylenol Maxalt 10 mg PRN Ubrelvy 100 mg PRN  Preventative Vitamin B2 Magnesium  Prior Therapies                                 Rescue: Maxalt 10 mg PRN - drowsiness Ubrelvy 100 mg PRN Flexeril Tizanidine Promethazine 25 mg PRN  Prevention: Topamax - side effects Effexor - helped but caused side effects Cymbalta - side effects Imipramine - side effects Emgality - helped but caused injection site reaction, joint pain   LABS: CBC    Component Value Date/Time   WBC 4.3 05/22/2015 1431   WBC 3.8 (L) 07/20/2014 0711   RBC 3.60 (L) 05/22/2015 1431   RBC 3.69 (L) 07/20/2014 0711   HGB 11.8 05/22/2015 1431   HCT 34.3 (L) 05/22/2015 1431   PLT 155  05/22/2015 1431   MCV 95.4 05/22/2015 1431   MCH 32.7 05/22/2015 1431   MCH 33.1 07/20/2014 0711   MCHC 34.3 05/22/2015 1431   MCHC 35.2 07/20/2014 0711   RDW 12.0 05/22/2015 1431   LYMPHSABS 1.3 05/22/2015 1431   MONOABS 0.4 05/22/2015 1431   EOSABS 0.1 05/22/2015 1431   BASOSABS 0.0 05/22/2015 1431      Latest Ref Rng & Units 05/22/2015    2:31 PM 05/26/2014    8:37 AM 04/04/2014    9:26 AM  CMP  Glucose 70 - 140 mg/dl 94  82  78   BUN 7.0 - 26.0 mg/dL 15.9  16  18.9   Creatinine 0.6 - 1.1 mg/dL 0.8  0.72  0.8   Sodium 136 - 145 mEq/L 141  143  141   Potassium 3.5 - 5.1 mEq/L 4.3  4.8  4.5   Chloride 96 - 112 mEq/L  104    CO2 22 - 29 mEq/L _0 Calcium 8.4 - 10.4 mg/dL 9.0  8.8  8.6   Total Protein 6.4 - 8.3 g/dL 6.5   6.7   Total Bilirubin 0.20 - 1.20 mg/dL 0.26   0.43   Alkaline Phos 40 - 150 U/L 48   50   AST 5 - 34 U/L 17   20   ALT 0 - 55 U/L 12  16    01/24/22: ANA, RF, CRP, ESR, CBC, CMP, B12 wnl  IMAGING:  MRI brain 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  Had an MRI 4 years ago which was reportedly normal   Current Outpatient Medications on File Prior to Visit  Medication Sig Dispense Refill   acetaminophen (TYLENOL) 650 MG CR tablet Take 650 mg by mouth every 8 (eight) hours as needed for pain.     ARMOUR THYROID 90 MG tablet Take 1 tablet (90 mg total) by mouth daily. 90 tablet 2   bifidobacterium infantis (ALIGN) capsule See admin instructions.     Cholecalciferol 100 MCG (4000 UT) CAPS Take 4,000 Units by mouth daily.     cyclobenzaprine (FLEXERIL) 5 MG tablet Take 5 mg by mouth as needed for muscle spasms.     diphenhydrAMINE HCl (BENADRYL PO) Take 25 mg by mouth daily. Pt states she takes at bedtime     fexofenadine (ALLEGRA ALLERGY) 180 MG tablet Take 180 mg by mouth daily.     fluticasone (FLONASE) 50 MCG/ACT nasal spray Place into the nose.     Galcanezumab-gnlm (EMGALITY) 120 MG/ML SOSY      Magnesium Oxide 250  MG TABS 1 tablet as needed     Menatetrenone (VITAMIN K2) 100 MCG TABS Take 100 mcg by mouth daily.     Omega-3 Fatty Acids (FISH OIL) 1000 MG CAPS Take by mouth daily.     omeprazole (PRILOSEC) 20 MG capsule 1 capsule 30 minutes before morning meal     promethazine (PHENERGAN) 25 MG tablet      Riboflavin (VITAMIN B2 PO) Take by mouth.     rizatriptan (MAXALT-MLT) 10 MG disintegrating tablet SMARTSIG:1 Tablet(s) By Mouth 1-2 Times Daily     sucralfate (CARAFATE) 1 g tablet Take 1 g by mouth 2 (two) times daily.     Theanine 100 MG CAPS Take by mouth.     Thiamine HCl (VITAMIN B1) 100 MG TABS 2.5 tablet     Turmeric 500 MG CAPS 3 capsules     venlafaxine (EFFEXOR) 75 MG tablet Take 75 mg by mouth daily.     No current facility-administered medications on file prior to visit.     Allergies: Allergies  Allergen Reactions   Topiramate Other (See Comments)    Headache   Galcanezumab-Gnlm Other (See Comments)    Family History: Family History  Problem Relation Age of Onset   Diabetes Mother    Hyperlipidemia Mother    Colon polyps Mother    Sleep apnea Mother    Hypertension Mother    Hyperlipidemia Father    Hypertension Father    Heart disease Father    Heart failure Father    Other Father        fructose intolerant   Sleep apnea Father    Cardiomyopathy Father    Hypertension Brother    Hypertension Brother    CVA Maternal Aunt    Colon cancer Maternal Uncle    Diabetes Maternal Grandmother    Cancer - Other Maternal Grandmother    CVA Maternal Grandmother    Meniere's disease Maternal Grandmother    Hypertension Maternal Grandmother    Colon cancer Maternal Grandmother    Heart attack Maternal Grandfather    Hypertension Maternal Grandfather    Hypertension Paternal Grandmother    Heart attack Paternal Grandfather    Thyroid disease Neg Hx     Past Medical History: Past Medical History:  Diagnosis Date  ASCUS of cervix with negative high risk HPV 06/2018    Breast cancer ([place]) 2010   Hypothyroidism    Migraine    Personal history of radiation therapy    2011 left breast   Urgency of urination     Past Surgical History Past Surgical History:  Procedure Laterality Date   AUGMENTATION MAMMAPLASTY Bilateral    BREAST LUMPECTOMY Left 09/25/2009   BREAST SURGERY  2010   left   BREAST SURGERY  04/2015   CAPSULECTOMY Left 07/20/2014   Procedure: LEFT BREAST CAPSULECTOMY/REPLACE IMPLANT FOR RECONSTRUCTION OF LEFT BREAST;  Surgeon: Crissie Reese, MD;  Location: [facility];  Service: Plastics;  Laterality: Left;   DIAGNOSTIC LAPAROSCOPY  1996   Endometriosis    Social History: Social History   Tobacco Use   Smoking status: Never   Smokeless tobacco: Never  Vaping Use   Vaping Use: Never used  Substance Use Topics   Alcohol use: No   Drug use: No    ROS: Negative for fevers, chills. Positive for headaches. All other systems reviewed and negative unless stated otherwise in HPI.   Physical Exam:   Vital Signs: BP 125/83   Pulse 71   Ht 5' 3" (1.6 m)   Wt 128 lb 9.6 oz (58.3 kg)   LMP 09/22/2012   BMI 22.78 kg/m  GENERAL: well appearing,in no acute distress,alert SKIN:  Color, texture, turgor normal. No rashes or lesions HEAD:  Normocephalic/atraumatic. CV:  RRR RESP: Normal respiratory effort MSK: no tenderness to palpation over occiput, neck, or shoulders  NEUROLOGICAL: Mental Status: Alert, oriented to person, place and time,Follows commands Cranial Nerves: PERRL, visual fields intact to confrontation, extraocular movements intact, facial sensation intact, no facial droop or ptosis, hearing grossly intact, no dysarthria Motor: muscle strength 5/5 both upper and lower extremities Reflexes: 2+ throughout Sensation: intact to light touch all 4 extremities Coordination: Finger-to- nose-finger intact bilaterally Gait: normal-based   IMPRESSION: 56 year old female with a history of OSA, breast cancer, hypothyroidism who  presents for evaluation of headaches. Her headache pattern is most consistent with chronic migraine. She has failed multiple preventive medications due to side effects. Will start Botox for migraine prevention. Link to [employer] provided.  PLAN: -Prevention: Start Botox -Rescue: Continue Ubrelvy 100 mg PRN, Maxalt 10 mg PRN -Next steps: consider Nurtec, naratriptan if unable to afford Ubrelvy   I spent a total of 41 minutes chart reviewing and counseling the patient. Headache education was done. Discussed treatment options including preventive and acute medications. Discussed medication side effects, adverse reactions and drug interactions. Written educational materials and patient instructions outlining all of the above were given.  Follow-up: 6 months   Genia Harold, MD 10/16/2022   10:14 AM

## 2022-10-16 NOTE — Telephone Encounter (Signed)
Patient Advocate Encounter   Received notification that prior authorization for Botox 200UNIT solution is required.   PA submitted on 10/16/2022 Key [id number] Status is pending       [name], [name] Patient Advocate Specialist [facility] Patient Advocate Team Direct Number: [phone]  Fax: [phone]

## 2022-10-16 NOTE — Telephone Encounter (Signed)
New start for this patient. J Code- [id number] ICD: E17.837

## 2022-10-16 NOTE — Patient Instructions (Signed)
[email or web address]  GENERAL HEADACHE INFORMATION:  Natural supplements: Magnesium Oxide or Magnesium Glycinate 500 mg at bed (up to 800 mg daily) Coenzyme Q10 300 mg in AM Vitamin B2- 200 mg twice a day  Add 1 supplement at a time since even natural supplements can have undesirable side effects. You can sometimes buy supplements cheaper (especially Coenzyme Q10) at www.https://compton-perez.com/ or at [employer].  Vitamins and herbs that show potential:  Magnesium: Magnesium (250 mg twice a day or 500 mg at bed) has a relaxant effect on smooth muscles such as blood vessels. Individuals suffering from frequent or daily headache usually have low magnesium levels which can be increase with daily supplementation of 400-750 mg. Three trials found 40-90% average headache reduction  when used as a preventative. Magnesium also demonstrated the benefit in menstrually related migraine.  Magnesium is part of the messenger system in the serotonin cascade and it is a good muscle relaxant.  It is also useful for constipation which can be a side effect of other medications used to treat migraine. Good sources include nuts, whole grains, and tomatoes. Side Effects: loose stool/diarrhea Riboflavin (vitamin B 2) 200 mg twice a day. This vitamin assists nerve cells in the production of ATP a principal energy storing molecule.  It is necessary for many chemical reactions in the body.  There have been at least 3 clinical trials of riboflavin using 400 mg per day all of which suggested that migraine frequency can be decreased.  All 3 trials showed significant improvement in over half of migraine sufferers.  The supplement is found in bread, cereal, milk, meat, and poultry.  Most Americans get more riboflavin than the recommended daily allowance, however riboflavin deficiency is not necessary for the supplements to help prevent headache. Side effects: energizing, green urine  Coenzyme Q10: This is present in almost all cells in the body and  is critical component for the conversion of energy.  Recent studies have shown that a nutritional supplement of CoQ10 can reduce the frequency of migraine attacks by improving the energy production of cells as with riboflavin.  Doses of 150 mg twice a day have been shown to be effective.  Melatonin: Increasing evidence shows correlation between melatonin secretion and headache conditions.  Melatonin supplementation has decreased headache intensity and duration.  It is widely used as a sleep aid.  Sleep is natures way of dealing with migraine.  A dose of 3 mg is recommended to start for headaches including cluster headache. Higher doses up to 15 mg has been reviewed for use in Cluster headache and have been used. The rationale behind using melatonin for cluster is that many theories regarding the cause of Cluster headache center around the disruption of the normal circadian rhythm in the brain.  This helps restore the normal circadian rhythm.  Ginger: Ginger has a small amount of antihistamine and anti-inflammatory action which may help headache.  It is primarily used for nausea and may aid in the absorption of other medications. HEADACHE DIET: Foods and beverages which may trigger migraine Note that only 20% of headache patients are food sensitive. You will know if you are food sensitive if you get a headache consistently 20 minutes to 2 hours after eating a certain food. Only cut out a food if it causes headaches, otherwise you might remove foods you enjoy! What matters most for diet is to eat a well balanced healthy diet full of vegetables and low fat protein, and to not miss meals.  Chocolate, other sweets  ALL cheeses except cottage and cream cheese Dairy products, yogurt, sour cream, ice cream Liver Meat extracts (Bovril, Marmite, meat tenderizers) Meats or fish which have undergone aging, fermenting, pickling or smoking. These include: Hotdogs,salami,Lox,sausage, mortadellas,smoked salmon,  pepperoni, Pickled herring Pods of broad bean (English beans, Chinese pea pods, [place] (fava) beans, lima and navy beans Ripe avocado, ripe banana Yeast extracts or active yeast preparations such as Brewer's or Fleishman's (commercial bakes goods are permitted) Tomato based foods, pizza (lasagna, etc.)  MSG (monosodium glutamate) is disguised as many things; look for these common aliases: Monopotassium glutamate Autolysed yeast Hydrolysed protein Sodium caseinate "flavorings" "all natural preservatives" Nutrasweet  Avoid all other foods that convincingly provoke headaches.  Resources: The Dizzy Lu Duffel Your Headache Diet, migrainestrong.com  https://www.aguirre.org/  Caffeine and Migraine For patients that have migraine, caffeine intake more than 3 days per week can lead to dependency and increased migraine frequency. I would recommend cutting back on your caffeine intake as best you can. The recommended amount of caffeine is 200-300 mg daily, although migraine patients may experience dependency at even lower doses. While you may notice an increase in headache temporarily, cutting back will be helpful for headaches in the long run. For more information on caffeine and migraine, visit: https://americanmigrainefoundation.org/resource-library/caffeine-and-migraine/  Headache Prevention Strategies:  1. Maintain a headache diary; learn to identify and avoid triggers.  - This can be a simple note where you log when you had a headache, associated symptoms, and medications used - There are several smartphone apps developed to help track migraines: Migraine Buddy, Migraine Monitor, Curelator N1-Headache App  Common triggers include: Emotional triggers: Emotional/Upset family or friends Emotional/Upset occupation Business reversal/success Anticipation anxiety Crisis-serious Post-crisis periodNew job/position   Physical  triggers: Vacation Day Weekend Strenuous Exercise High Altitude Location New Move Menstrual Day Physical Illness Oversleep/Not enough sleep Weather changes Light: Photophobia or light sesnitivity treatment involves a balance between desensitization and reduction in overly strong input. Use dark polarized glasses outside, but not inside. Avoid bright or fluorescent light, but do not dim environment to the point that going into a normally lit room hurts. Consider FL-41 tint lenses, which reduce the most irritating wavelengths without blocking too much light.  These can be obtained at axonoptics.com or theraspecs.com Foods: see list above.  2. Limit use of acute treatments (over-the-counter medications, triptans, etc.) to no more than 2 days per week or 10 days per month to prevent medication overuse headache (rebound headache).    3. Follow a regular schedule (including weekends and holidays): Don't skip meals. Eat a balanced diet. 8 hours of sleep nightly. Minimize stress. Exercise 30 minutes per day. Being overweight is associated with a 5 times increased risk of chronic migraine. Keep well hydrated and drink 6-8 glasses of water per day.  4. Initiate non-pharmacologic measures at the earliest onset of your headache. Rest and quiet environment. Relax and reduce stress. Breathe2Relax is a free app that can instruct you on    some simple relaxtion and breathing techniques. Http://Dawnbuse.com is a    free website that provides teaching videos on relaxation.  Also, there are  many apps that   can be downloaded for "mindful" relaxation.  An app called YOGA NIDRA will help walk you through mindfulness. Another app called Calm can be downloaded to give you a structured mindfulness guide with daily reminders and skill development. Headspace for guided meditation Mindfulness Based Stress Reduction Online Course: www.palousemindfulness.com Cold compresses.  5. Don't wait!! Take the maximum  allowable dosage  of prescribed medication at the first sign of migraine.  6. Compliance:  Take prescribed medication regularly as directed and at the first sign of a migraine.  7. Communicate:  Call your physician when problems arise, especially if your headaches change, increase in frequency/severity, or become associated with neurological symptoms (weakness, numbness, slurred speech, etc.).  8. Headache/pain management therapies: Consider various complementary methods, including medication, behavioral therapy, psychological counselling, biofeedback, massage therapy, acupuncture, dry needling, and other modalities.  Such measures may reduce the need for medications. Counseling for pain management, where patients learn to function and ignore/minimize their pain, seems to work very well.  9. Recommend changing family's attention and focus away from patient's headaches. Instead, emphasize daily activities. If first question of day is 'How are your headaches/Do you have a headache today?', then patient will constantly think about headaches, thus making them worse. Goal is to re-direct attention away from headaches, toward daily activities and other distractions.  10. Helpful Websites: www.AmericanHeadacheSociety.org VoipObserver.it www.headaches.org GolfingFamily.no www.achenet.org

## 2022-10-17 ENCOUNTER — Other Ambulatory Visit (HOSPITAL_COMMUNITY): Payer: Self-pay

## 2022-10-17 NOTE — Telephone Encounter (Signed)
Patient Advocate Encounter  Prior Authorization for Botox 200UNIT solution  has been approved.     Effective dates: 10/16/2022 through 04/02/2023  Buy and [place]ton Sin, [place] Patient Advocate Specialist [place] Patient Advocate Team Direct Number: [phone]  Fax: [phone]

## 2022-10-20 NOTE — Telephone Encounter (Signed)
Scheduled with Frann Rider for 11/05/22 at 12:45 pm.

## 2022-11-02 ENCOUNTER — Other Ambulatory Visit: Payer: Self-pay | Admitting: Endocrinology

## 2022-11-04 NOTE — Progress Notes (Unsigned)
Update 11/05/2022 JM: Patient is being seen for initial Botox injection.  Currently experiencing *** migraine days per month.    Consent Form Botulism Toxin Injection For Chronic Migraine    Reviewed orally with patient, additionally signature is on file:  Botulism toxin has been approved by the Federal drug administration for treatment of chronic migraine. Botulism toxin does not cure chronic migraine and it may not be effective in some patients.  The administration of botulism toxin is accomplished by injecting a small amount of toxin into the muscles of the neck and head. Dosage must be titrated for each individual. Any benefits resulting from botulism toxin tend to wear off after 3 months with a repeat injection required if benefit is to be maintained. Injections are usually done every 3-4 months with maximum effect peak achieved by about 2 or 3 weeks. Botulism toxin is expensive and you should be sure of what costs you will incur resulting from the injection.  The side effects of botulism toxin use for chronic migraine may include:   -Transient, and usually mild, facial weakness with facial injections  -Transient, and usually mild, head or neck weakness with head/neck injections  -Reduction or loss of forehead facial animation due to forehead muscle weakness  -Eyelid drooping  -Dry eye  -Pain at the site of injection or bruising at the site of injection  -Double vision  -Potential unknown long term risks   Contraindications: You should not have Botox if you are pregnant, nursing, allergic to albumin, have an infection, skin condition, or muscle weakness at the site of the injection, or have myasthenia gravis, Lambert-Eaton syndrome, or ALS.  It is also possible that as with any injection, there may be an allergic reaction or no effect from the medication. Reduced effectiveness after repeated injections is sometimes seen and rarely infection at the injection site may occur. All  care will be taken to prevent these side effects. If therapy is given over a long time, atrophy and wasting in the muscle injected may occur. Occasionally the patient's become refractory to treatment because they develop antibodies to the toxin. In this event, therapy needs to be modified.  I have read the above information and consent to the administration of botulism toxin.    BOTOX PROCEDURE NOTE FOR MIGRAINE HEADACHE  Contraindications and precautions discussed with patient(above). Aseptic procedure was observed and patient tolerated procedure. Procedure performed by Frann Rider, AGNP-BC.   The condition has existed for more than 6 months, and pt does not have a diagnosis of ALS, Myasthenia Gravis or Lambert-Eaton Syndrome.  Risks and benefits of injections discussed and pt agrees to proceed with the procedure.  Written consent obtained  These injections are medically necessary. Pt  receives good benefits from these injections. These injections do not cause sedations or hallucinations which the oral therapies may cause.   Description of procedure:  The patient was placed in a sitting position. The standard protocol was used for Botox as follows, with 5 units of Botox injected at each site:  -Procerus muscle, midline injection  -Corrugator muscle, bilateral injection  -Frontalis muscle, bilateral injection, with 2 sites each side, medial injection was performed in the upper one third of the frontalis muscle, in the region vertical from the medial inferior edge of the superior orbital rim. The lateral injection was again in the upper one third of the forehead vertically above the lateral limbus of the cornea, 1.5 cm lateral to the medial injection site.  -Temporalis muscle  injection, 4 sites, bilaterally. The first injection was 3 cm above the tragus of the ear, second injection site was 1.5 cm to 3 cm up from the first injection site in line with the tragus of the ear. The third  injection site was 1.5-3 cm forward between the first 2 injection sites. The fourth injection site was 1.5 cm posterior to the second injection site. 5th site laterally in the temporalis  muscleat the level of the outer canthus.  -Occipitalis muscle injection, 3 sites, bilaterally. The first injection was done one half way between the occipital protuberance and the tip of the mastoid process behind the ear. The second injection site was done lateral and superior to the first, 1 fingerbreadth from the first injection. The third injection site was 1 fingerbreadth superiorly and medially from the first injection site.  -Cervical paraspinal muscle injection, 2 sites, bilaterally. The first injection site was 1 cm from the midline of the cervical spine, 3 cm inferior to the lower border of the occipital protuberance. The second injection site was 1.5 cm superiorly and laterally to the first injection site.  -Trapezius muscle injection was performed at 3 sites, bilaterally. The first injection site was in the upper trapezius muscle halfway between the inflection point of the neck, and the acromion. The second injection site was one half way between the acromion and the first injection site. The third injection was done between the first injection site and the inflection point of the neck.    A total of 200 units of Botox was prepared, 155 units of Botox was injected as documented above, any Botox not injected was wasted. The patient tolerated the procedure well, there were no complications of the above procedure.   Frann Rider, AGNP-BC  [name] [address] [place], [place] [address]  Phone [phone] Fax [phone] Note: This document was prepared with digital dictation and possible smart phrase technology. Any transcriptional errors that result from this process are unintentional.

## 2022-11-05 ENCOUNTER — Ambulatory Visit (INDEPENDENT_AMBULATORY_CARE_PROVIDER_SITE_OTHER): Payer: BC Managed Care – PPO | Admitting: Adult Health

## 2022-11-05 ENCOUNTER — Encounter: Payer: Self-pay | Admitting: Adult Health

## 2022-11-05 VITALS — Ht 63.0 in | Wt 131.2 lb

## 2022-11-05 DIAGNOSIS — G43009 Migraine without aura, not intractable, without status migrainosus: Secondary | ICD-10-CM

## 2022-11-05 MED ORDER — ONABOTULINUMTOXINA 200 UNITS IJ SOLR
155.0000 [IU] | Freq: Once | INTRAMUSCULAR | Status: AC
  Administered 2022-11-05: 155 [IU] via INTRAMUSCULAR

## 2022-11-05 NOTE — Progress Notes (Signed)
Botox- 200 units x 1 vial Lot: [id number] Expiration: 03/2025 NDC: 0023-3921-02  Bacteriostatic 0.9% Sodium Chloride- 4mL total Lot: [id number] Expiration: 09/2024 NDC: 63323-924-03  Dx: G43.709  B/B 

## 2022-11-17 ENCOUNTER — Telehealth: Payer: Self-pay

## 2022-11-17 NOTE — Telephone Encounter (Signed)
PA submitted via CMM  Key: [id number] Approved Effective from 11/17/2022 through 02/08/2023

## 2022-11-18 DIAGNOSIS — K219 Gastro-esophageal reflux disease without esophagitis: Secondary | ICD-10-CM | POA: Diagnosis not present

## 2022-11-18 DIAGNOSIS — K2980 Duodenitis without bleeding: Secondary | ICD-10-CM | POA: Diagnosis not present

## 2022-11-18 DIAGNOSIS — K319 Disease of stomach and duodenum, unspecified: Secondary | ICD-10-CM | POA: Diagnosis not present

## 2022-11-18 DIAGNOSIS — K317 Polyp of stomach and duodenum: Secondary | ICD-10-CM | POA: Diagnosis not present

## 2022-11-18 DIAGNOSIS — R1013 Epigastric pain: Secondary | ICD-10-CM | POA: Diagnosis not present

## 2022-12-19 ENCOUNTER — Telehealth: Payer: Self-pay

## 2022-12-19 NOTE — Telephone Encounter (Signed)
Patient would like to know if she needs to continue taking half a tablet of Armour Thyroid 90 on Wednesdays or start taking a whole tablet.

## 2022-12-23 NOTE — Telephone Encounter (Signed)
Pt informed

## 2023-01-12 ENCOUNTER — Other Ambulatory Visit: Payer: Self-pay

## 2023-01-12 MED ORDER — UBRELVY 100 MG PO TABS: 100.0000 mg | ORAL_TABLET | ORAL | 6 refills | Status: DC | PRN

## 2023-01-28 ENCOUNTER — Ambulatory Visit: Payer: BC Managed Care – PPO | Admitting: Adult Health

## 2023-02-04 ENCOUNTER — Telehealth: Payer: Self-pay

## 2023-02-04 NOTE — Telephone Encounter (Signed)
PA sent to CMM/Ubrelvy Key: [id number] Waiting for response.

## 2023-02-05 ENCOUNTER — Ambulatory Visit: Payer: BC Managed Care – PPO | Admitting: Adult Health

## 2023-02-10 ENCOUNTER — Telehealth: Payer: Self-pay | Admitting: Adult Health

## 2023-02-10 ENCOUNTER — Ambulatory Visit (INDEPENDENT_AMBULATORY_CARE_PROVIDER_SITE_OTHER): Payer: BC Managed Care – PPO | Admitting: Family Medicine

## 2023-02-10 VITALS — Ht 63.5 in | Wt 125.2 lb

## 2023-02-10 DIAGNOSIS — G43009 Migraine without aura, not intractable, without status migrainosus: Secondary | ICD-10-CM

## 2023-02-10 MED ORDER — ONABOTULINUMTOXINA 200 UNITS IJ SOLR
155.0000 [IU] | Freq: Once | INTRAMUSCULAR | Status: AC
  Administered 2023-02-10: 155 [IU] via INTRAMUSCULAR

## 2023-02-10 NOTE — Progress Notes (Signed)
02/10/23 ALL: [name] returns for second Botox procedure. She is followed by Dr Delena Bali and Ihor Austin, NP. First procedure 11/05/2022. She continues Ubrelvy for abortive therapy. She reports headaches improved initially but have worsened again toward end of Botox cycle. She had 11 migraines this past month but only 4-5 the two months prior. She had more sinus pressure following last procedure but, otherwise, tolerated it well.     Consent Form Botulism Toxin Injection For Chronic Migraine    Reviewed orally with patient, additionally signature is on file:  Botulism toxin has been approved by the Federal drug administration for treatment of chronic migraine. Botulism toxin does not cure chronic migraine and it may not be effective in some patients.  The administration of botulism toxin is accomplished by injecting a small amount of toxin into the muscles of the neck and head. Dosage must be titrated for each individual. Any benefits resulting from botulism toxin tend to wear off after 3 months with a repeat injection required if benefit is to be maintained. Injections are usually done every 3-4 months with maximum effect peak achieved by about 2 or 3 weeks. Botulism toxin is expensive and you should be sure of what costs you will incur resulting from the injection.  The side effects of botulism toxin use for chronic migraine may include:   -Transient, and usually mild, facial weakness with facial injections  -Transient, and usually mild, head or neck weakness with head/neck injections  -Reduction or loss of forehead facial animation due to forehead muscle weakness  -Eyelid drooping  -Dry eye  -Pain at the site of injection or bruising at the site of injection  -Double vision  -Potential unknown long term risks   Contraindications: You should not have Botox if you are pregnant, nursing, allergic to albumin, have an infection, skin condition, or muscle weakness at the site of the  injection, or have myasthenia gravis, Lambert-Eaton syndrome, or ALS.  It is also possible that as with any injection, there may be an allergic reaction or no effect from the medication. Reduced effectiveness after repeated injections is sometimes seen and rarely infection at the injection site may occur. All care will be taken to prevent these side effects. If therapy is given over a long time, atrophy and wasting in the muscle injected may occur. Occasionally the patient's become refractory to treatment because they develop antibodies to the toxin. In this event, therapy needs to be modified.  I have read the above information and consent to the administration of botulism toxin.    BOTOX PROCEDURE NOTE FOR MIGRAINE HEADACHE  Contraindications and precautions discussed with patient(above). Aseptic procedure was observed and patient tolerated procedure. Procedure performed by Shawnie Dapper, FNP-C.   The condition has existed for more than 6 months, and pt does not have a diagnosis of ALS, Myasthenia Gravis or Lambert-Eaton Syndrome.  Risks and benefits of injections discussed and pt agrees to proceed with the procedure.  Written consent obtained  These injections are medically necessary. Pt  receives good benefits from these injections. These injections do not cause sedations or hallucinations which the oral therapies may cause.   Description of procedure:  The patient was placed in a sitting position. The standard protocol was used for Botox as follows, with 5 units of Botox injected at each site:  -Procerus muscle, midline injection  -Corrugator muscle, bilateral injection  -Frontalis muscle, bilateral injection, with 2 sites each side, medial injection was performed in the upper one third  of the frontalis muscle, in the region vertical from the medial inferior edge of the superior orbital rim. The lateral injection was again in the upper one third of the forehead vertically above the lateral  limbus of the cornea, 1.5 cm lateral to the medial injection site.  -Temporalis muscle injection, 4 sites, bilaterally. The first injection was 3 cm above the tragus of the ear, second injection site was 1.5 cm to 3 cm up from the first injection site in line with the tragus of the ear. The third injection site was 1.5-3 cm forward between the first 2 injection sites. The fourth injection site was 1.5 cm posterior to the second injection site. 5th site laterally in the temporalis  muscleat the level of the outer canthus.  -Occipitalis muscle injection, 3 sites, bilaterally. The first injection was done one half way between the occipital protuberance and the tip of the mastoid process behind the ear. The second injection site was done lateral and superior to the first, 1 fingerbreadth from the first injection. The third injection site was 1 fingerbreadth superiorly and medially from the first injection site.  -Cervical paraspinal muscle injection, 2 sites, bilaterally. The first injection site was 1 cm from the midline of the cervical spine, 3 cm inferior to the lower border of the occipital protuberance. The second injection site was 1.5 cm superiorly and laterally to the first injection site.  -Trapezius muscle injection was performed at 3 sites, bilaterally. The first injection site was in the upper trapezius muscle halfway between the inflection point of the neck, and the acromion. The second injection site was one half way between the acromion and the first injection site. The third injection was done between the first injection site and the inflection point of the neck.   Will return for repeat injection in 3 months.   A total of 200 units of Botox was prepared, 155 units of Botox was injected as documented above, any Botox not injected was wasted. The patient tolerated the procedure well, there were no complications of the above procedure.

## 2023-02-10 NOTE — Progress Notes (Signed)
Botox-200U x 1vial Lot: [id number] Expiration: 04/2025 NDC: 2575-0518-33  Bacteriostatic 0.9% Sodium Chloride- 28mL total [id number] Expiration:09/2024 NDC: 10312-811-88  Antionette Char witnessed drawing up of sodium chloride and Haleigh Freeman,CMA witnessed Botox.  Buy/bill

## 2023-02-10 NOTE — Telephone Encounter (Signed)
Pt's current auth expires 04/02/23, she will need a new one before 05/18/23 appt. Thank you!

## 2023-02-13 DIAGNOSIS — E0500 Thyrotoxicosis with diffuse goiter without thyrotoxic crisis or storm: Secondary | ICD-10-CM | POA: Diagnosis not present

## 2023-02-13 DIAGNOSIS — H2513 Age-related nuclear cataract, bilateral: Secondary | ICD-10-CM | POA: Diagnosis not present

## 2023-02-13 DIAGNOSIS — D3131 Benign neoplasm of right choroid: Secondary | ICD-10-CM | POA: Diagnosis not present

## 2023-02-16 ENCOUNTER — Other Ambulatory Visit: Payer: Self-pay | Admitting: Endocrinology

## 2023-02-17 NOTE — Progress Notes (Signed)
Office Visit Note  Patient: [name]             Date of Birth: 1966/03/22           MRN: [id number]             PCP: Laurann Montana, MD Referring: Carilyn Goodpasture, NP Visit Date: 03/03/2023 Occupation: @GUAROCC @  Subjective:  Pain in joints  History of Present Illness: [name] is a 57 y.o. female seen in consultation per request of her PCP.  According the patient her symptoms started about 1-1/2 years ago with abdominal pain followed by generalized pain in her joints and muscles.  She states the symptoms lingered for about 3 months.  She also started noticing swelling in her joints after abdominal pain.  The swelling was mostly in her hands.  Symptoms lasted for about 24 hours then resolved.  Over time she realized that when she took Prilosec her symptoms resolved.  She started taking Prilosec 40 mg twice daily which has been helpful.  She has been taking Prilosec on a regular basis now.  She has not had any symptoms in the last 1 year.  She was also having intermittent discomfort in her feet due to plantar fasciitis and after wearing shoes with orthotics her symptoms resolved.  She has realized over the years that she started having sensitivity to dairy, gluten and sugar.  She has been avoiding these ingredients in her diet.  She also mentions that 8 years ago she had a tick bite but did not develop a rash.  In 2016 she had antibiotics after having a Lyme test which was questionably positive.  After taking antibiotics she realized that some of the pain she was having in the nose and ear cartilage resolved.  She has not had any symptoms since then.  She continues to have migraine headaches.  She is followed by neurologist.  She is concerned that she may have histamine sensitivity due to neurology as an migraines.  There is no family history of autoimmune disease.  She is gravida 2, para 2, miscarriages 0.  There is no history of DVTs.    Activities of Daily Living:  Patient reports  morning stiffness for 0 minutes.   Patient Denies nocturnal pain.  Difficulty dressing/grooming: Denies Difficulty climbing stairs: Denies Difficulty getting out of chair: Denies Difficulty using hands for taps, buttons, cutlery, and/or writing: Denies  Review of Systems  Constitutional:  Positive for fatigue.  HENT:  Positive for mouth dryness. Negative for mouth sores.   Eyes:  Positive for dryness.  Respiratory:  Negative for shortness of breath.   Cardiovascular:  Negative for chest pain and palpitations.  Gastrointestinal:  Negative for blood in stool, constipation and diarrhea.  Endocrine: Positive for increased urination.  Genitourinary:  Positive for involuntary urination.  Musculoskeletal:  Positive for myalgias and myalgias. Negative for joint pain, gait problem, joint pain, joint swelling, muscle weakness, morning stiffness and muscle tenderness.  Skin:  Negative for color change, rash, hair loss and sensitivity to sunlight.  Allergic/Immunologic: Negative for susceptible to infections.  Neurological:  Positive for dizziness, numbness and headaches.  Hematological:  Negative for swollen glands.  Psychiatric/Behavioral:  Positive for sleep disturbance. Negative for depressed mood. The patient is nervous/anxious.     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 breast (HCC) 12/02/2013    Past Medical History:  Diagnosis Date   ASCUS of cervix with negative high risk HPV 06/2018   Breast cancer (HCC) 2010    Hypothyroidism    Migraine    Personal history of radiation therapy    2011 left breast   Urgency of urination     Family History  Problem Relation Age of Onset   Diabetes Mother    Hyperlipidemia Mother    Colon polyps Mother    Sleep apnea Mother    Hypertension Mother    Hyperlipidemia Father    Hypertension Father    Heart disease Father    Heart failure Father    Other Father        fructose intolerant   Sleep apnea Father    Cardiomyopathy Father    Hypertension Brother    Hypertension Brother    CVA Maternal Aunt    Colon cancer Maternal Uncle    Diabetes Maternal Grandmother    Cancer - Other Maternal Grandmother    CVA Maternal Grandmother    Meniere's disease Maternal Grandmother    Hypertension Maternal Grandmother    Colon cancer Maternal Grandmother    Heart attack Maternal Grandfather    Hypertension Maternal Grandfather    Hypertension Paternal Grandmother    Heart attack Paternal Grandfather    Healthy Son    Healthy Son    Thyroid disease Neg Hx    Past Surgical History:  Procedure Laterality Date   AUGMENTATION MAMMAPLASTY Bilateral    BREAST LUMPECTOMY Left 09/25/2009   BREAST SURGERY  2010   left   BREAST SURGERY  04/2015   CAPSULECTOMY Left 07/20/2014   Procedure: LEFT BREAST CAPSULECTOMY/REPLACE IMPLANT FOR RECONSTRUCTION OF LEFT BREAST;  Surgeon: Etter Sjogren, MD;  Location: [facility] OR;  Service: Plastics;  Laterality: Left;   DIAGNOSTIC LAPAROSCOPY  1996   Endometriosis   Social History   Social History Narrative   Not on file   Immunization History  Administered Date(s) Administered   Influenza,inj,Quad PF,6+ Mos 07/19/2019   Influenza-Unspecified 07/19/2019, 08/20/2022   PFIZER(Purple Top)SARS-COV-2 Vaccination 02/02/2020, 02/27/2020, 09/04/2020     Objective: Vital Signs: BP 119/78 (BP Location: Right Arm, Patient Position: Sitting, Cuff Size: Normal)   Pulse 75   Resp 15   Ht 5' 3.5" (1.613 m)   Wt 123 lb 6.4 oz (56 kg)   LMP  09/22/2012   BMI 21.52 kg/m    Physical Exam Vitals and nursing note reviewed.  Constitutional:      Appearance: She is well-developed.  HENT:     Head: Normocephalic and atraumatic.  Eyes:     Conjunctiva/sclera: Conjunctivae normal.  Cardiovascular:     Rate and Rhythm: Normal rate and regular rhythm.     Heart sounds: Normal heart sounds.  Pulmonary:     Effort: Pulmonary effort is normal.     Breath sounds: Normal breath sounds.  Abdominal:     General: Bowel sounds are normal.     Palpations: Abdomen is soft.  Musculoskeletal:     Cervical back: Normal range of motion.  Lymphadenopathy:     Cervical: No cervical adenopathy.  Skin:    General:  Skin is warm and dry.     Capillary Refill: Capillary refill takes less than 2 seconds.  Neurological:     Mental Status: She is alert and oriented to person, place, and time.  Psychiatric:        Behavior: Behavior normal.      Musculoskeletal Exam: Cervical, thoracic and lumbar spine.  She had no SI joint tenderness.  Shoulder joints, elbow joints, wrist joints were in good range of motion.  She had bilateral CMC, PIP and DIP thickening with no synovitis.  No synovitis was noted over MCP joints.  Hip joints and knee joints in good range of motion.  She had bilateral pes cavus and hallux rigidus.  She had mild tenderness over left first PIP joint.  No warmth or swelling was noted.  CDAI Exam: CDAI Score: -- Patient Global: --; Provider Global: -- Swollen: --; Tender: -- Joint Exam 03/03/2023   No joint exam has been documented for this visit   There is currently no information documented on the homunculus. Go to the Rheumatology activity and complete the homunculus joint exam.  Investigation: No additional findings.  Imaging: No results found.  Recent Labs: Lab Results  Component Value Date   WBC 4.3 05/22/2015   HGB 11.8 05/22/2015   PLT 155 05/22/2015   NA 141 05/22/2015   K 4.3 05/22/2015   CL 104 05/26/2014    CO2 30 (H) 05/22/2015   GLUCOSE 94 05/22/2015   BUN 15.9 05/22/2015   CREATININE 0.8 05/22/2015   BILITOT 0.26 05/22/2015   ALKPHOS 48 05/22/2015   AST 17 05/22/2015   ALT 12 05/22/2015   PROT 6.5 05/22/2015   ALBUMIN 3.5 05/22/2015   CALCIUM 9.0 05/22/2015   GFRAA >90 05/26/2014   January 24, 2022 RF negative, CBC normal, CMP normal, ESR 10, B12 normal, vitamin D 51.6, ANA negative, CRP normal, magnesium normal, SSA negative, SSB negative, TSH 0.32 low, T4 normal, T3 normal,  Speciality Comments: No specialty comments available.  Procedures:  No procedures performed Allergies: Topiramate and Galcanezumab-gnlm   Assessment / Plan:     Visit Diagnoses: Polyarthralgia-said her symptoms of joint pain started about 1-1/2 years ago.  She states the symptoms were related to to certain diet.  The symptoms always started after eating certain foods.  She states she has been avoiding dairy, gluten and sugar now.  She also takes Prilosec 40 mg twice daily which prevents her from having these episodes.  She has not had any episodes in the last 1 year.  Patient is concerned that she has food allergies.  Advised to schedule appoint with an allergist.  X-rays obtained today were consistent with osteoarthritis.  Joint protection muscle strengthening was discussed.  I advised her to contact me if she develops any new symptoms.  Most recent labs obtained on January 24, 2022 were within normal limits which were reviewed with the patient.  X-ray findings were also reviewed with the patient.  Pain in both hands -she complains of pain and discomfort in her hands.  She had bilateral PIP and DIP thickening.  CMC prominence was noted.  Clinical findings are consistent with osteoarthritis.  Plan: XR Hand 2 View Right, XR Hand 2 View Left.  X-rays of bilateral hands showed severe osteoarthritis involving PIP and DIP joints.  X-ray findings were reviewed with the patient.  Joint protection muscle strengthening was  discussed.  A handout on hand exercises was given.  Detailed counsel regarding osteoarthritis was provided.  A handout  on osteoarthritis was placed in the AVS.  Pain in both feet -she had bilateral pes cavus and bilateral hallux rigidus.  She complains of discomfort over the left first PIP joint.  No synovitis was noted.  She had no difficulty walking on her tiptoes and heels.  Plan: XR Foot 2 Views Right, XR Foot 2 Views Left.  X-rays showed bilateral hallux rigidus and osteoarthritic changes.  Left calcaneal spur was noted.  X-ray findings were reviewed with the patient.  Proper fitting shoes with arch support were advised.  Pes cavus of both feet-she has bilateral pes cavus.  She was advised to wear shoes with arch support.  Plantar fasciitis of left foot-patient states that her symptoms have improved since she has been wearing shoes at home.  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  She states when she avoids gluten she does not have GI symptoms and joint symptoms.  Malignant neoplasm of upper-inner quadrant of left breast in female, estrogen receptor positive (HCC) - 2010,lumpectomy and RTX. Tamoxifen x 3 years.  Chronic fatigue-improved.  History of migraine -she continues to have severe migraines.  She gets botox injections.  She is followed by Dr. Delena Bali.  Sleep apnea in adult-(non-obstructive) - uses mouth guard  History of gastroesophageal reflux (GERD)-she is on Prilosec 40 mg twice daily per patient.  Anxiety  Vitamin D deficiency-vitamin D level was normal.  Seasonal allergies-gives history of seasonal allergies and food allergies.  She thinks she is allergic to dairy, gluten, sugar and histamines.  I advised her to schedule an appointment with an allergist to evaluate this further.  Orders: Orders Placed This Encounter  Procedures   XR Hand 2 View Right   XR Hand 2 View Left   XR Foot 2 Views Right   XR Foot 2 Views Left   No orders of  the defined types were placed in this encounter.    Follow-Up Instructions: Return for Osteoarthritis.   Pollyann Savoy, MD  Note - This record has been created using Animal nutritionist.  Chart creation errors have been sought, but may not always  have been located. Such creation errors do not reflect on  the standard of medical care.

## 2023-02-18 ENCOUNTER — Encounter: Payer: Self-pay | Admitting: Family Medicine

## 2023-02-19 NOTE — Telephone Encounter (Addendum)
PA submitted via cover my meds (Key: [id number]) PA pending response from [employer].

## 2023-02-27 ENCOUNTER — Other Ambulatory Visit (INDEPENDENT_AMBULATORY_CARE_PROVIDER_SITE_OTHER): Payer: BC Managed Care – PPO

## 2023-02-27 DIAGNOSIS — E890 Postprocedural hypothyroidism: Secondary | ICD-10-CM | POA: Diagnosis not present

## 2023-02-27 LAB — T3, FREE: T3, Free: 4.1 pg/mL (ref 2.3–4.2)

## 2023-02-27 LAB — TSH: TSH: 0.95 u[IU]/mL (ref 0.35–5.50)

## 2023-02-27 LAB — T4, FREE: Free T4: 0.6 ng/dL (ref 0.60–1.60)

## 2023-03-03 ENCOUNTER — Ambulatory Visit (INDEPENDENT_AMBULATORY_CARE_PROVIDER_SITE_OTHER): Payer: BC Managed Care – PPO

## 2023-03-03 ENCOUNTER — Encounter: Payer: Self-pay | Admitting: Rheumatology

## 2023-03-03 ENCOUNTER — Ambulatory Visit: Payer: BC Managed Care – PPO

## 2023-03-03 ENCOUNTER — Encounter: Payer: Self-pay | Admitting: Endocrinology

## 2023-03-03 ENCOUNTER — Ambulatory Visit (INDEPENDENT_AMBULATORY_CARE_PROVIDER_SITE_OTHER): Payer: BC Managed Care – PPO | Admitting: Endocrinology

## 2023-03-03 ENCOUNTER — Ambulatory Visit: Payer: BC Managed Care – PPO | Attending: Rheumatology | Admitting: Rheumatology

## 2023-03-03 ENCOUNTER — Encounter: Payer: Self-pay | Admitting: Family Medicine

## 2023-03-03 VITALS — BP 119/78 | HR 75 | Resp 15 | Ht 63.5 in | Wt 123.4 lb

## 2023-03-03 VITALS — BP 118/82 | HR 69 | Ht 63.5 in | Wt 121.0 lb

## 2023-03-03 DIAGNOSIS — M79641 Pain in right hand: Secondary | ICD-10-CM

## 2023-03-03 DIAGNOSIS — M79642 Pain in left hand: Secondary | ICD-10-CM | POA: Diagnosis not present

## 2023-03-03 DIAGNOSIS — M722 Plantar fascial fibromatosis: Secondary | ICD-10-CM | POA: Diagnosis not present

## 2023-03-03 DIAGNOSIS — M2550 Pain in unspecified joint: Secondary | ICD-10-CM

## 2023-03-03 DIAGNOSIS — M79672 Pain in left foot: Secondary | ICD-10-CM | POA: Diagnosis not present

## 2023-03-03 DIAGNOSIS — Z1231 Encounter for screening mammogram for malignant neoplasm of breast: Secondary | ICD-10-CM

## 2023-03-03 DIAGNOSIS — M79671 Pain in right foot: Secondary | ICD-10-CM

## 2023-03-03 DIAGNOSIS — E890 Postprocedural hypothyroidism: Secondary | ICD-10-CM

## 2023-03-03 DIAGNOSIS — Q6672 Congenital pes cavus, left foot: Secondary | ICD-10-CM

## 2023-03-03 DIAGNOSIS — F419 Anxiety disorder, unspecified: Secondary | ICD-10-CM

## 2023-03-03 DIAGNOSIS — R5382 Chronic fatigue, unspecified: Secondary | ICD-10-CM

## 2023-03-03 DIAGNOSIS — J302 Other seasonal allergic rhinitis: Secondary | ICD-10-CM

## 2023-03-03 DIAGNOSIS — G4733 Obstructive sleep apnea (adult) (pediatric): Secondary | ICD-10-CM

## 2023-03-03 DIAGNOSIS — K9041 Non-celiac gluten sensitivity: Secondary | ICD-10-CM

## 2023-03-03 DIAGNOSIS — C50212 Malignant neoplasm of upper-inner quadrant of left female breast: Secondary | ICD-10-CM

## 2023-03-03 DIAGNOSIS — Q6671 Congenital pes cavus, right foot: Secondary | ICD-10-CM

## 2023-03-03 DIAGNOSIS — G4730 Sleep apnea, unspecified: Secondary | ICD-10-CM

## 2023-03-03 DIAGNOSIS — Z170 Estrogen receptor positive status [ER+]: Secondary | ICD-10-CM

## 2023-03-03 DIAGNOSIS — E559 Vitamin D deficiency, unspecified: Secondary | ICD-10-CM

## 2023-03-03 DIAGNOSIS — R591 Generalized enlarged lymph nodes: Secondary | ICD-10-CM

## 2023-03-03 DIAGNOSIS — Z8669 Personal history of other diseases of the nervous system and sense organs: Secondary | ICD-10-CM

## 2023-03-03 DIAGNOSIS — Z8719 Personal history of other diseases of the digestive system: Secondary | ICD-10-CM

## 2023-03-03 NOTE — Progress Notes (Signed)
Patient ID: [name], female   DOB: [birth year], 57 y.o.   MRN: [id number]             Reason for Appointment:  Hypothyroidism, follow-up    History of Present Illness:   Hypothyroidism was first diagnosed  when she was about 57 years old  At the time of diagnosis patient had been treated with radioactive iodine for hyperthyroidism  She says that when she has hypothyroidism she will be having symptoms of  fatigue, cold sensitivity, some weight change and dry skin.      The patient has been treated with  initially levothyroxine and since about 2017 has been on Armour Thyroid from a nurse practitioner in [place]  With starting Armour Thyroid supplementation the patient's symptoms appeared to be better controlled The dose had been adjusted prior to her coming here  Recent history:  On her initial consultation she was having increasing fatigue, cold intolerance and weight gain  Because of her TSH being low her Armour Thyroid was reduced by half tablet twice a week on her consultation in June 22 With this she had improvement in her fatigue, cold intolerance by no change in her dry skin  Currently taking NP thyroid, previously on Armour Thyroid 90 mg, 1 tablet on 6 days and half tablet on Mondays  She takes her thyroid medication daily about an hour before breakfast  She has not missed any doses recently  Although she generally feels fairly good she will sometimes have more fatigue especially when she has not slept well She thinks that she may feel more cold intolerant on the days she is taking 1/2 tablet  TSH is excellent at 0.95 Also free T4 is not low compared to previous visit and free T4 the same at 4.1         Patient's weight history is as follows:  Wt Readings from Last 3 Encounters:  03/03/23 121 lb (54.9 kg)  03/03/23 123 lb 6.4 oz (56 kg)  02/10/23 125 lb 3.2 oz (56.8 kg)    Thyroid function results have been as follows:  Lab Results  Component Value  Date   TSH 0.95 02/27/2023   TSH 0.40 08/21/2022   TSH 5.74 (H) 04/08/2022   TSH 0.29 (L) 11/26/2021   FREET4 0.60 02/27/2023   FREET4 0.54 (L) 08/21/2022   FREET4 0.65 04/08/2022   FREET4 0.60 11/26/2021   T3FREE 4.1 02/27/2023   T3FREE 4.1 04/08/2022   T3FREE 3.4 11/26/2021     Date   free T4   TSH   T3   12/12/2020 0.66  0.32  3.6  10/2020  0.78  <0.01  4.8  10/11/2019    0.7       Past Medical History:  Diagnosis Date   ASCUS of cervix with negative high risk HPV 06/2018   Breast cancer (HCC) 2010   Hypothyroidism    Migraine    Personal history of radiation therapy    2011 left breast   Urgency of urination     Past Surgical History:  Procedure Laterality Date   AUGMENTATION MAMMAPLASTY Bilateral    BREAST LUMPECTOMY Left 09/25/2009   BREAST SURGERY  2010   left   BREAST SURGERY  04/2015   CAPSULECTOMY Left 07/20/2014   Procedure: LEFT BREAST CAPSULECTOMY/REPLACE IMPLANT FOR RECONSTRUCTION OF LEFT BREAST;  Surgeon: Etter Sjogren, MD;  Location: [facility] OR;  Service: Plastics;  Laterality: Left;   DIAGNOSTIC LAPAROSCOPY  1996   Endometriosis  Family History  Problem Relation Age of Onset   Diabetes Mother    Hyperlipidemia Mother    Colon polyps Mother    Sleep apnea Mother    Hypertension Mother    Hyperlipidemia Father    Hypertension Father    Heart disease Father    Heart failure Father    Other Father        fructose intolerant   Sleep apnea Father    Cardiomyopathy Father    Hypertension Brother    Hypertension Brother    CVA Maternal Aunt    Colon cancer Maternal Uncle    Diabetes Maternal Grandmother    Cancer - Other Maternal Grandmother    CVA Maternal Grandmother    Meniere's disease Maternal Grandmother    Hypertension Maternal Grandmother    Colon cancer Maternal Grandmother    Heart attack Maternal Grandfather    Hypertension Maternal Grandfather    Hypertension Paternal Grandmother    Heart attack Paternal Grandfather    Healthy Son     Healthy Son    Thyroid disease Neg Hx     Social History:  reports that she has never smoked. She has been exposed to tobacco smoke. She has never used smokeless tobacco. She reports that she does not drink alcohol and does not use drugs.  Allergies:  Allergies  Allergen Reactions   Topiramate Other (See Comments)    Headache   Galcanezumab-Gnlm Other (See Comments)    Rash, joint pain, sadness    Allergies as of 03/03/2023       Reactions   Topiramate Other (See Comments)   Headache   Galcanezumab-gnlm Other (See Comments)   Rash, joint pain, sadness        Medication List        Accurate as of March 03, 2023 10:45 AM. If you have any questions, ask your nurse or doctor.          STOP taking these medications    Magnesium Oxide 250 MG Tabs Stopped by: Reather Littler, MD       TAKE these medications    acetaminophen 650 MG CR tablet Commonly known as: TYLENOL Take 650 mg by mouth every 8 (eight) hours as needed for pain.   Allegra Allergy 180 MG tablet Generic drug: fexofenadine Take 180 mg by mouth daily.   BENADRYL PO Take 25 mg by mouth at bedtime as needed. Pt states she takes at bedtime   bifidobacterium infantis capsule See admin instructions.   Cholecalciferol 100 MCG (4000 UT) Caps Take 4,000 Units by mouth daily.   cyclobenzaprine 5 MG tablet Commonly known as: FLEXERIL Take 5 mg by mouth as needed for muscle spasms.   Fish Oil 1000 MG Caps Take by mouth daily.   fluticasone 50 MCG/ACT nasal spray Commonly known as: FLONASE Place into the nose as needed.   NP Thyroid 90 MG tablet Generic drug: thyroid Take 1 tablet by mouth once daily   omeprazole 20 MG capsule Commonly known as: PRILOSEC 1 capsule 30 minutes before morning meal   promethazine 25 MG tablet Commonly known as: PHENERGAN as needed.   rizatriptan 10 MG disintegrating tablet Commonly known as: MAXALT-MLT SMARTSIG:1 Tablet(s) By Mouth 1-2 Times Daily    Theanine 100 MG Caps Take by mouth.   Ubrelvy 100 MG Tabs Generic drug: Ubrogepant Take 1 tablet (100 mg total) by mouth as needed (for migraines). May repeat a dose in 2 hours if headache persists   VITAMIN B2 PO  Take by mouth.   Vitamin K2 100 MCG Tabs Take 100 mcg by mouth daily.           Review of Systems  Recent blood pressure normal       BP Readings from Last 3 Encounters:  03/03/23 118/82  03/03/23 119/78  10/16/22 125/83   She takes Prilosec for GI symptoms        Examination:    BP 118/82   Pulse 69   Ht 5' 3.5" (1.613 m)   Wt 121 lb (54.9 kg)   LMP 09/22/2012   SpO2 97%   BMI 21.10 kg/m   No swelling of the face or puffiness of the eyes   Assessment:  HYPOTHYROIDISM, post radioactive iodine  She has been on Armour Thyroid for the last few years which subjectively has helped her more than levothyroxine with her hypothyroidism symptoms  Currently taking NP thyroid, 90 mg 6-1/2 tablets a week  Recently she has fatigue at times related likely to her insomnia or other causes as her thyroid levels are excellent She has been regular with her supplements as before  She has had mild weight loss from following a low calorie low-fat diet along with her husband  PLAN:   She will continue the same prescription of Armour Thyroid 90 mg tablets with half tablet on Mondays Call if starting to have persistent fatigue  Recommended that she can try melatonin at dinnertime to see if this helps her insomnia Also does not need to restrict her diet as much to prevent any further weight loss  Reather Littler 03/03/2023, 10:45 AM     Note: This office note was prepared with Dragon voice recognition system technology. Any transcriptional errors that result from this process are unintentional.

## 2023-03-03 NOTE — Patient Instructions (Addendum)
Hand Exercises Hand exercises can be helpful for almost anyone. These exercises can strengthen the hands, improve flexibility and movement, and increase blood flow to the hands. These results can make work and daily tasks easier. Hand exercises can be especially helpful for people who have joint pain from arthritis or have nerve damage from overuse (carpal tunnel syndrome). These exercises can also help people who have injured a hand. Exercises Most of these hand exercises are gentle stretching and motion exercises. It is usually safe to do them often throughout the day. Warming up your hands before exercise may help to reduce stiffness. You can do this with gentle massage or by placing your hands in warm water for 10-15 minutes. It is normal to feel some stretching, pulling, tightness, or mild discomfort as you begin new exercises. This will gradually improve. Stop an exercise right away if you feel sudden, severe pain or your pain gets worse. Ask your health care provider which exercises are best for you. Knuckle bend or "claw" fist  Stand or sit with your arm, hand, and all five fingers pointed straight up. Make sure to keep your wrist straight during the exercise. Gently bend your fingers down toward your palm until the tips of your fingers are touching the top of your palm. Keep your big knuckle straight and just bend the small knuckles in your fingers. Hold this position for __________ seconds. Straighten (extend) your fingers back to the starting position. Repeat this exercise 5-10 times with each hand. Full finger fist  Stand or sit with your arm, hand, and all five fingers pointed straight up. Make sure to keep your wrist straight during the exercise. Gently bend your fingers into your palm until the tips of your fingers are touching the middle of your palm. Hold this position for __________ seconds. Extend your fingers back to the starting position, stretching every joint fully. Repeat  this exercise 5-10 times with each hand. Straight fist Stand or sit with your arm, hand, and all five fingers pointed straight up. Make sure to keep your wrist straight during the exercise. Gently bend your fingers at the big knuckle, where your fingers meet your hand, and the middle knuckle. Keep the knuckle at the tips of your fingers straight and try to touch the bottom of your palm. Hold this position for __________ seconds. Extend your fingers back to the starting position, stretching every joint fully. Repeat this exercise 5-10 times with each hand. Tabletop  Stand or sit with your arm, hand, and all five fingers pointed straight up. Make sure to keep your wrist straight during the exercise. Gently bend your fingers at the big knuckle, where your fingers meet your hand, as far down as you can while keeping the small knuckles in your fingers straight. Think of forming a tabletop with your fingers. Hold this position for __________ seconds. Extend your fingers back to the starting position, stretching every joint fully. Repeat this exercise 5-10 times with each hand. Finger spread  Place your hand flat on a table with your palm facing down. Make sure your wrist stays straight as you do this exercise. Spread your fingers and thumb apart from each other as far as you can until you feel a gentle stretch. Hold this position for __________ seconds. Bring your fingers and thumb tight together again. Hold this position for __________ seconds. Repeat this exercise 5-10 times with each hand. Making circles  Stand or sit with your arm, hand, and all five fingers pointed   straight up. Make sure to keep your wrist straight during the exercise. Make a circle by touching the tip of your thumb to the tip of your index finger. Hold for __________ seconds. Then open your hand wide. Repeat this motion with your thumb and each finger on your hand. Repeat this exercise 5-10 times with each hand. Thumb  motion  Sit with your forearm resting on a table and your wrist straight. Your thumb should be facing up toward the ceiling. Keep your fingers relaxed as you move your thumb. Lift your thumb up as high as you can toward the ceiling. Hold for __________ seconds. Bend your thumb across your palm as far as you can, reaching the tip of your thumb for the small finger (pinkie) side of your palm. Hold for __________ seconds. Repeat this exercise 5-10 times with each hand. Grip strengthening  Hold a stress ball or other soft ball in the middle of your hand. Slowly increase the pressure, squeezing the ball as much as you can without causing pain. Think of bringing the tips of your fingers into the middle of your palm. All of your finger joints should bend when doing this exercise. Hold your squeeze for __________ seconds, then relax. Repeat this exercise 5-10 times with each hand. Contact a health care provider if: Your hand pain or discomfort gets much worse when you do an exercise. Your hand pain or discomfort does not improve within 2 hours after you exercise. If you have any of these problems, stop doing these exercises right away. Do not do them again unless your health care provider says that you can. Get help right away if: You develop sudden, severe hand pain or swelling. If this happens, stop doing these exercises right away. Do not do them again unless your health care provider says that you can. This information is not intended to replace advice given to you by your health care provider. Make sure you discuss any questions you have with your health care provider. Document Revised: 01/31/2021 Document Reviewed: 02/07/2021 Elsevier Patient Education  2023 Elsevier Inc.   Osteoarthritis  Osteoarthritis is a type of arthritis. It refers to joint pain or joint disease. Osteoarthritis affects tissue that covers the ends of bones in joints (cartilage). Cartilage acts as a cushion between the  bones and helps them move smoothly. Osteoarthritis occurs when cartilage in the joints gets worn down. Osteoarthritis is sometimes called "wear and tear" arthritis. Osteoarthritis is the most common form of arthritis. It often occurs in older people. It is a condition that gets worse over time. The joints most often affected by this condition are in the fingers, toes, hips, knees, and spine, including the neck and lower back. What are the causes? This condition is caused by the wearing down of cartilage that covers the ends of bones. What increases the risk? The following factors may make you more likely to develop this condition: Being age 50 or older. Obesity. Overuse of joints. Past injury of a joint. Past surgery on a joint. Family history of osteoarthritis. What are the signs or symptoms? The main symptoms of this condition are pain, swelling, and stiffness in the joint. Other symptoms may include: An enlarged joint. More pain and further damage caused by small pieces of bone or cartilage that break off and float inside of the joint. Small deposits of bone (osteophytes) that grow on the edges of the joint. A grating or scraping feeling inside the joint when you move it. Popping or   creaking sounds when you move. Difficulty walking or exercising. An inability to grip items, twist your hand, or control the movements of your hands and fingers. How is this diagnosed? This condition may be diagnosed based on: Your medical history. A physical exam. Your symptoms. X-rays of the affected joints. Blood tests to rule out other types of arthritis. How is this treated? There is no cure for this condition, but treatment can help control pain and improve joint function. Treatment may include a combination of therapies, such as: Pain relief techniques, such as: Applying heat and cold to the joint. Massage. A form of talk therapy called cognitive behavioral therapy (CBT). This therapy helps you  set goals and follow up on the changes that you make. Medicines for pain and inflammation. The medicines can be taken by mouth or applied to the skin. They include: NSAIDs, such as ibuprofen. Prescription medicines. Strong anti-inflammatory medicines (corticosteroids). Certain nutritional supplements. A prescribed exercise program. You may work with a physical therapist. Assistive devices, such as a brace, wrap, splint, specialized glove, or cane. A weight control plan. Surgery, such as: An osteotomy. This is done to reposition the bones and relieve pain or to remove loose pieces of bone and cartilage. Joint replacement surgery. You may need this surgery if you have advanced osteoarthritis. Follow these instructions at home: Activity Rest your affected joints as told by your health care provider. Exercise as told by your provider. The provider may recommend specific types of exercise, such as: Strengthening exercises. These are done to strengthen the muscles that support joints affected by arthritis. Aerobic activities. These are exercises, such as brisk walking or water aerobics, that increase your heart rate. Range-of-motion activities. These help your joints move more easily. Balance and agility exercises. Managing pain, stiffness, and swelling     If told, apply heat to the affected area as often as told by your provider. Use the heat source that your provider recommends, such as a moist heat pack or a heating pad. If you have a removable assistive device, remove it as told by your provider. Place a towel between your skin and the heat source. If your provider tells you to keep the assistive device on while you apply heat, place a towel between the assistive device and the heat source. Leave the heat on for 20-30 minutes. If told, put ice on the affected area. If you have a removable assistive device, remove it as told by your provider. Put ice in a plastic bag. Place a towel  between your skin and the bag. If your provider tells you to keep the assistive device on during icing, place a towel between the assistive device and the bag. Leave the ice on for 20 minutes, 2-3 times a day. If your skin turns bright red, remove the ice or heat right away to prevent skin damage. The risk of damage is higher if you cannot feel pain, heat, or cold. Move your fingers or toes often to reduce stiffness and swelling. Raise (elevate) the affected area above the level of your heart while you are sitting or lying down. General instructions Take over-the-counter and prescription medicines only as told by your provider. Maintain a healthy weight. Follow instructions from your provider for weight control. Do not use any products that contain nicotine or tobacco. These products include cigarettes, chewing tobacco, and vaping devices, such as e-cigarettes. If you need help quitting, ask your provider. Use assistive devices as told by your provider. Where to find more   information National Institute of Arthritis and Musculoskeletal and Skin Diseases: niams.nih.gov National Institute on Aging: nia.nih.gov American College of Rheumatology: rheumatology.org Contact a health care provider if: You have redness, swelling, or a feeling of warmth in a joint that gets worse. You have a fever along with joint or muscle aches. You develop a rash. You have trouble doing your normal activities. You have pain that gets worse and is not relieved by pain medicine. This information is not intended to replace advice given to you by your health care provider. Make sure you discuss any questions you have with your health care provider. Document Revised: 06/19/2022 Document Reviewed: 06/19/2022 Elsevier Patient Education  2023 Elsevier Inc.  

## 2023-03-04 ENCOUNTER — Other Ambulatory Visit: Payer: Self-pay | Admitting: Family Medicine

## 2023-03-04 DIAGNOSIS — Z79899 Other long term (current) drug therapy: Secondary | ICD-10-CM | POA: Diagnosis not present

## 2023-03-04 DIAGNOSIS — E039 Hypothyroidism, unspecified: Secondary | ICD-10-CM | POA: Diagnosis not present

## 2023-03-04 DIAGNOSIS — E559 Vitamin D deficiency, unspecified: Secondary | ICD-10-CM | POA: Diagnosis not present

## 2023-03-04 DIAGNOSIS — K219 Gastro-esophageal reflux disease without esophagitis: Secondary | ICD-10-CM | POA: Diagnosis not present

## 2023-03-04 DIAGNOSIS — Z0000 Encounter for general adult medical examination without abnormal findings: Secondary | ICD-10-CM | POA: Diagnosis not present

## 2023-03-04 DIAGNOSIS — Z1159 Encounter for screening for other viral diseases: Secondary | ICD-10-CM | POA: Diagnosis not present

## 2023-03-04 DIAGNOSIS — Z1231 Encounter for screening mammogram for malignant neoplasm of breast: Secondary | ICD-10-CM

## 2023-03-10 ENCOUNTER — Ambulatory Visit: Payer: BC Managed Care – PPO | Admitting: Adult Health

## 2023-04-03 ENCOUNTER — Encounter: Payer: Self-pay | Admitting: Psychiatry

## 2023-04-09 NOTE — Telephone Encounter (Signed)
-   may come to office for migraine infusion. - may use OTC sleep aids and follow sleep hygiene strategies. - ok to use maxalt and ubrelvy together.  - left eyelid heaviness not related to melatonin.   Suanne Marker, MD 04/09/2023, 5:26 PM Certified in Neurology, Neurophysiology and Neuroimaging  [facility] [address] [place], [place] [address] [phone]

## 2023-04-16 ENCOUNTER — Ambulatory Visit
Admission: RE | Admit: 2023-04-16 | Discharge: 2023-04-16 | Disposition: A | Discharge status: Home / Self Care | Payer: BC Managed Care – PPO | Source: Ambulatory Visit | Attending: Family Medicine | Admitting: Family Medicine

## 2023-04-16 DIAGNOSIS — Z1231 Encounter for screening mammogram for malignant neoplasm of breast: Secondary | ICD-10-CM

## 2023-04-20 ENCOUNTER — Other Ambulatory Visit: Payer: Self-pay | Admitting: Family Medicine

## 2023-04-20 DIAGNOSIS — R928 Other abnormal and inconclusive findings on diagnostic imaging of breast: Secondary | ICD-10-CM

## 2023-04-21 ENCOUNTER — Telehealth: Payer: Self-pay | Admitting: Adult Health

## 2023-04-21 NOTE — Telephone Encounter (Signed)
Completed [employer] continuation form, placed in nurse pod for NP signature. Current auth expired 04/02/23.

## 2023-04-22 NOTE — Telephone Encounter (Signed)
Faxed signed renewal form along with clinical notes to [employer] at [phone].

## 2023-04-23 NOTE — Telephone Encounter (Signed)
Received approval from [employer] # [id number] (04/22/23-03/23/24)

## 2023-05-11 ENCOUNTER — Ambulatory Visit: Payer: BC Managed Care – PPO | Admitting: Family Medicine

## 2023-05-18 ENCOUNTER — Ambulatory Visit: Payer: BC Managed Care – PPO | Admitting: Adult Health

## 2023-05-18 DIAGNOSIS — G43009 Migraine without aura, not intractable, without status migrainosus: Secondary | ICD-10-CM | POA: Diagnosis not present

## 2023-05-18 DIAGNOSIS — R519 Headache, unspecified: Secondary | ICD-10-CM

## 2023-05-18 DIAGNOSIS — R29818 Other symptoms and signs involving the nervous system: Secondary | ICD-10-CM | POA: Diagnosis not present

## 2023-05-18 MED ORDER — QULIPTA 60 MG PO TABS
60.0000 mg | ORAL_TABLET | Freq: Every day | ORAL | 11 refills | Status: DC
Start: 2023-05-18 — End: 2024-02-05

## 2023-05-18 NOTE — Progress Notes (Signed)
Referring:  Laurann Montana, MD [phone] [address] [place],  [place] [address]  PCP: Laurann Montana, MD  Neurology was asked to evaluate [name], a 57 year old female for a chief complaint of headaches.  Our recommendations of care will be communicated by shared medical record.    CC:  headaches  History provided from self    Brief HPI:  [name] is a 57 y.o. female with longstanding history of migraine headaches since college and becoming more frequent over time.  She was started on Botox with initial injection 11/2022 and repeat injection 02/2023 with noted improvement of migraine severity.   Interval history:   Patient came in today for repeat Botox injection with prior injection on 02/10/2023.  Prior to receiving injection, she notes having gradual worsening of migraines and associated with new features since that time.  She had 9 migraine days in May and June and started to experience new associated symptoms consisting of left eye heaviness, left lightheadedness and left arm heaviness and bilateral hand numbness. Symptoms are not always present with her migraines, does not experience these symptoms without having a migraine headache. Denies any weakness or vision changes associated.  She also complains of difficulty sleeping and recently placed on trazodone by PCP but has not noticed much benefit.  She does admit to increased stressors at home. She does endorse since starting Botox, her migraines are not as severe and are no longer associated with nausea. Prior to starting botox, she was experiencing at least 15 migraine days per month. Botox was not administered today.    Headache History:  Migraine days per month: 9   Current Treatment: Abortive Tylenol Maxalt 10 mg PRN Ubrelvy 100 mg PRN  Preventative Botox   Prior Therapies                                 Rescue: Maxalt 10 mg PRN - drowsiness Ubrelvy 100 mg  PRN Flexeril Tizanidine Promethazine 25 mg PRN  Prevention: Topamax - side effects Effexor - helped but caused side effects Cymbalta - side effects Imipramine - side effects Emgality - helped but caused injection site reaction, joint pain Botox    LABS: CBC    Component Value Date/Time   WBC 4.3 05/22/2015 1431   WBC 3.8 (L) 07/20/2014 0711   RBC 3.60 (L) 05/22/2015 1431   RBC 3.69 (L) 07/20/2014 0711   HGB 11.8 05/22/2015 1431   HCT 34.3 (L) 05/22/2015 1431   PLT 155 05/22/2015 1431   MCV 95.4 05/22/2015 1431   MCH 32.7 05/22/2015 1431   MCH 33.1 07/20/2014 0711   MCHC 34.3 05/22/2015 1431   MCHC 35.2 07/20/2014 0711   RDW 12.0 05/22/2015 1431   LYMPHSABS 1.3 05/22/2015 1431   MONOABS 0.4 05/22/2015 1431   EOSABS 0.1 05/22/2015 1431   BASOSABS 0.0 05/22/2015 1431      Latest Ref Rng & Units 05/22/2015    2:31 PM 05/26/2014    8:37 AM 04/04/2014    9:26 AM  CMP  Glucose 70 - 140 mg/dl 94  82  78   BUN 7.0 - 26.0 mg/dL 40.9  16  81.1   Creatinine 0.6 - 1.1 mg/dL 0.8  9.14  0.8   Sodium 136 - 145 mEq/L 141  143  141   Potassium 3.5 - 5.1 mEq/L 4.3  4.8  4.5   Chloride 96 - 112  mEq/L  104    CO2 22 - 29 mEq/L 30  29  24    Calcium 8.4 - 10.4 mg/dL 9.0  8.8  8.6   Total Protein 6.4 - 8.3 g/dL 6.5   6.7   Total Bilirubin 0.20 - 1.20 mg/dL 1.61   0.96   Alkaline Phos 40 - 150 U/L 48   50   AST 5 - 34 U/L 17   20   ALT 0 - 55 U/L 12   16    01/24/22: ANA, RF, CRP, ESR, CBC, CMP, B12 wnl  IMAGING:  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  Had an MRI 4 years ago which was reportedly normal   Current Outpatient Medications on File Prior to Visit  Medication Sig Dispense Refill   acetaminophen (TYLENOL) 650 MG CR tablet Take 650 mg by mouth every 8 (eight) hours as needed for pain.     bifidobacterium infantis (ALIGN) capsule See admin instructions.     Cholecalciferol 100 MCG (4000 UT) CAPS Take 4,000 Units by mouth daily.      cyclobenzaprine (FLEXERIL) 5 MG tablet Take 5 mg by mouth as needed for muscle spasms.     fexofenadine (ALLEGRA ALLERGY) 180 MG tablet Take 180 mg by mouth daily.     fluticasone (FLONASE) 50 MCG/ACT nasal spray Place into the nose as needed.     Menatetrenone (VITAMIN K2) 100 MCG TABS Take 100 mcg by mouth daily.     NP THYROID 90 MG tablet Take 1 tablet by mouth once daily 90 tablet 0   Omega-3 Fatty Acids (FISH OIL) 1000 MG CAPS Take by mouth daily.     omeprazole (PRILOSEC) 20 MG capsule Take 20 mg by mouth 2 (two) times daily before a meal.     Riboflavin (VITAMIN B2 PO) Take by mouth.     rizatriptan (MAXALT-MLT) 10 MG disintegrating tablet SMARTSIG:1 Tablet(s) By Mouth 1-2 Times Daily     Theanine 100 MG CAPS Take by mouth.     traZODone (DESYREL) 50 MG tablet Take 50 mg by mouth at bedtime.     Ubrogepant (UBRELVY) 100 MG TABS Take 1 tablet (100 mg total) by mouth as needed (for migraines). May repeat a dose in 2 hours if headache persists 16 tablet 6   No current facility-administered medications on file prior to visit.     Allergies: Allergies  Allergen Reactions   Topiramate Other (See Comments)    Headache   Galcanezumab-Gnlm Other (See Comments)    Rash, joint pain, sadness    Family History: Family History  Problem Relation Age of Onset   Diabetes Mother    Hyperlipidemia Mother    Colon polyps Mother    Sleep apnea Mother    Hypertension Mother    Hyperlipidemia Father    Hypertension Father    Heart disease Father    Heart failure Father    Other Father        fructose intolerant   Sleep apnea Father    Cardiomyopathy Father    Hypertension Brother    Hypertension Brother    CVA Maternal Aunt    Colon cancer Maternal Uncle    Diabetes Maternal Grandmother    Cancer - Other Maternal Grandmother    CVA Maternal Grandmother    Meniere's disease Maternal Grandmother    Hypertension Maternal Grandmother    Colon cancer Maternal Grandmother    Heart  attack Maternal Grandfather  Hypertension Maternal Grandfather    Hypertension Paternal Grandmother    Heart attack Paternal Grandfather    Healthy Son    Healthy Son    Thyroid disease Neg Hx     Past Medical History: Past Medical History:  Diagnosis Date   ASCUS of cervix with negative high risk HPV 06/2018   Breast cancer (HCC) 2010   Hypothyroidism    Migraine    Personal history of radiation therapy    2011 left breast   Urgency of urination     Past Surgical History Past Surgical History:  Procedure Laterality Date   AUGMENTATION MAMMAPLASTY Bilateral    BREAST LUMPECTOMY Left 09/25/2009   BREAST SURGERY  2010   left   BREAST SURGERY  04/2015   CAPSULECTOMY Left 07/20/2014   Procedure: LEFT BREAST CAPSULECTOMY/REPLACE IMPLANT FOR RECONSTRUCTION OF LEFT BREAST;  Surgeon: Etter Sjogren, MD;  Location: [facility] OR;  Service: Plastics;  Laterality: Left;   DIAGNOSTIC LAPAROSCOPY  1996   Endometriosis    Social History: Social History   Tobacco Use   Smoking status: Never    Passive exposure: Past   Smokeless tobacco: Never  Vaping Use   Vaping status: Never Used  Substance Use Topics   Alcohol use: No   Drug use: No    ROS: Negative for fevers, chills. Positive for headaches. All other systems reviewed and negative unless stated otherwise in HPI.   Physical Exam:   Vital Signs: LMP 09/22/2012  GENERAL: well appearing,in no acute distress,alert SKIN:  Color, texture, turgor normal. No rashes or lesions HEAD:  Normocephalic/atraumatic. CV:  RRR RESP: Normal respiratory effort MSK: no tenderness to palpation over occiput, neck, or shoulders  NEUROLOGICAL: Mental Status: Alert, oriented to person, place and time,Follows commands Cranial Nerves: PERRL, visual fields intact to confrontation, extraocular movements intact, facial sensation intact, no facial droop or ptosis, hearing grossly intact, no dysarthria Motor: muscle strength 5/5 both upper and lower  extremities Reflexes: 2+ throughout Sensation: intact to light touch all 4 extremities Coordination: Finger-to- nose-finger intact bilaterally Gait: normal-based   IMPRESSION: 57 year old female with a history of OSA, breast cancer, hypothyroidism who presents for evaluation of headaches. Her headache pattern is most consistent with chronic migraine. She has failed multiple preventive medications due to side effects. Onset of new symptoms of left eye lid, arm and leg heaviness associated with migraine headaches around end of April/June 2024. Still having about 9 migraine days per month on botox although botox did lessen severity.    PLAN: -Prevention: Start Qulipta 60 mg daily -Rescue: Continue Ubrelvy 100 mg PRN, Maxalt 10 mg PRN -Next steps: consider Vyepti -complete MR brain due to new migraine features.    Follow-up in 4 months or call earlier if needed   I spent 41 minutes of face-to-face and non-face-to-face time with patient.  This included previsit chart review, lab review, study review, order entry, electronic health record documentation, patient education  Ihor Austin, [facility]  [facility] [address] [place], [place] [address]  Phone [phone] Fax [phone] Note: This document was prepared with digital dictation and possible smart phrase technology. Any transcriptional errors that result from this process are unintentional.

## 2023-05-18 NOTE — Patient Instructions (Addendum)
Your Plan:  Recommend starting Quilpta for further migraine control   If no benefit or difficulty tolerating, would recommend pursuing Vyepti at that point which is an infusion every 3 months  Recommend completing an MRI brain for further evaluation with the recent change in your migraine features   Continue Ubrelvy and Maxalt as needed for rescue     Follow up in 4 months or call earlier if needed     Thank you for coming to see [place] at [facility]. I hope we have been able to provide you high quality care today.  You may receive a patient satisfaction survey over the next few weeks. We would appreciate your feedback and comments so that we may continue to improve ourselves and the health of our patients.

## 2023-05-19 ENCOUNTER — Telehealth: Payer: Self-pay | Admitting: Adult Health

## 2023-05-19 NOTE — Telephone Encounter (Signed)
Pt scheduled for 45 mins MR brain w/wo contrast at [facility] for 06/10/23 at 2:30pm  [employer] auth# [id number] (05/19/23-06/17/23)

## 2023-05-20 ENCOUNTER — Other Ambulatory Visit: Payer: BC Managed Care – PPO

## 2023-05-20 DIAGNOSIS — F4323 Adjustment disorder with mixed anxiety and depressed mood: Secondary | ICD-10-CM | POA: Diagnosis not present

## 2023-05-26 ENCOUNTER — Ambulatory Visit: Payer: BC Managed Care – PPO

## 2023-05-26 ENCOUNTER — Ambulatory Visit
Admission: RE | Admit: 2023-05-26 | Discharge: 2023-05-26 | Disposition: A | Discharge status: Home / Self Care | Payer: BC Managed Care – PPO | Source: Ambulatory Visit | Attending: Family Medicine | Admitting: Family Medicine

## 2023-05-26 DIAGNOSIS — R928 Other abnormal and inconclusive findings on diagnostic imaging of breast: Secondary | ICD-10-CM

## 2023-05-26 DIAGNOSIS — N6312 Unspecified lump in the right breast, upper inner quadrant: Secondary | ICD-10-CM | POA: Diagnosis not present

## 2023-05-27 ENCOUNTER — Other Ambulatory Visit: Payer: Self-pay | Admitting: Family Medicine

## 2023-05-27 DIAGNOSIS — N63 Unspecified lump in unspecified breast: Secondary | ICD-10-CM

## 2023-05-28 ENCOUNTER — Other Ambulatory Visit (HOSPITAL_COMMUNITY): Payer: Self-pay

## 2023-05-28 ENCOUNTER — Telehealth: Payer: Self-pay

## 2023-05-28 NOTE — Telephone Encounter (Signed)
Pharmacy Patient Advocate Encounter   Received notification from Patient Pharmacy that prior authorization for Qulipta 60MG  tablets is required/requested.   Insurance verification completed.   The patient is insured through [employer] [employer] .   Per test claim: PA required; PA started via CoverMyMeds. KEY [id number] . Waiting for clinical questions to populate.

## 2023-05-29 NOTE — Telephone Encounter (Signed)
    I see in chart PT has been using Botox 2x-do not see any upcoming visit but it is not showing D/C'd in Epic medlist-please advise.

## 2023-06-01 NOTE — Telephone Encounter (Signed)
Clinical questions have been submitted-awaiting determination. 

## 2023-06-02 DIAGNOSIS — F4323 Adjustment disorder with mixed anxiety and depressed mood: Secondary | ICD-10-CM | POA: Diagnosis not present

## 2023-06-03 DIAGNOSIS — F419 Anxiety disorder, unspecified: Secondary | ICD-10-CM | POA: Diagnosis not present

## 2023-06-03 DIAGNOSIS — R202 Paresthesia of skin: Secondary | ICD-10-CM | POA: Diagnosis not present

## 2023-06-03 DIAGNOSIS — F320 Major depressive disorder, single episode, mild: Secondary | ICD-10-CM | POA: Diagnosis not present

## 2023-06-03 DIAGNOSIS — E039 Hypothyroidism, unspecified: Secondary | ICD-10-CM | POA: Diagnosis not present

## 2023-06-03 DIAGNOSIS — G4700 Insomnia, unspecified: Secondary | ICD-10-CM | POA: Diagnosis not present

## 2023-06-03 NOTE — Telephone Encounter (Signed)
Pharmacy Patient Advocate Encounter  Received notification from [facility] that Prior Authorization for Qulipta 60MG  tablets has been DENIED. Please advise how you'd like to proceed. Full denial letter will be uploaded to the media tab. See denial reason below.     PA #/Case ID/Reference #: PA Case ID #: [id number]

## 2023-06-08 ENCOUNTER — Encounter: Payer: Self-pay | Admitting: Psychiatry

## 2023-06-09 DIAGNOSIS — F4323 Adjustment disorder with mixed anxiety and depressed mood: Secondary | ICD-10-CM | POA: Diagnosis not present

## 2023-06-09 NOTE — Telephone Encounter (Signed)
Returned patients voicemail, had to leave voicemail.  Per billing, can not offer appt the same day as her MRI with our office. Can offer any other 10:00 slot on Dr Karn Cassis schedule

## 2023-06-09 NOTE — Telephone Encounter (Signed)
Let's have her schedule an office visit to discuss her symptoms. This can be either with me or an NP if I don't have any availability

## 2023-06-10 ENCOUNTER — Ambulatory Visit
Admission: RE | Admit: 2023-06-10 | Discharge: 2023-06-10 | Disposition: A | Discharge status: Home / Self Care | Payer: BC Managed Care – PPO | Source: Ambulatory Visit | Attending: Family Medicine | Admitting: Family Medicine

## 2023-06-10 ENCOUNTER — Ambulatory Visit: Payer: BC Managed Care – PPO

## 2023-06-10 DIAGNOSIS — N6312 Unspecified lump in the right breast, upper inner quadrant: Secondary | ICD-10-CM | POA: Diagnosis not present

## 2023-06-10 DIAGNOSIS — N63 Unspecified lump in unspecified breast: Secondary | ICD-10-CM

## 2023-06-10 DIAGNOSIS — N6031 Fibrosclerosis of right breast: Secondary | ICD-10-CM | POA: Diagnosis not present

## 2023-06-10 HISTORY — PX: BREAST BIOPSY: SHX20

## 2023-06-11 ENCOUNTER — Ambulatory Visit (INDEPENDENT_AMBULATORY_CARE_PROVIDER_SITE_OTHER): Payer: BC Managed Care – PPO | Admitting: Nurse Practitioner

## 2023-06-11 ENCOUNTER — Encounter: Payer: Self-pay | Admitting: Nurse Practitioner

## 2023-06-11 ENCOUNTER — Other Ambulatory Visit (HOSPITAL_COMMUNITY)
Admission: RE | Admit: 2023-06-11 | Discharge: 2023-06-11 | Disposition: A | Discharge status: Home / Self Care | Payer: BC Managed Care – PPO | Source: Ambulatory Visit | Attending: Nurse Practitioner | Admitting: Nurse Practitioner

## 2023-06-11 VITALS — BP 124/68 | HR 73 | Ht 63.0 in | Wt 121.0 lb

## 2023-06-11 DIAGNOSIS — Z780 Asymptomatic menopausal state: Secondary | ICD-10-CM | POA: Diagnosis not present

## 2023-06-11 DIAGNOSIS — Z124 Encounter for screening for malignant neoplasm of cervix: Secondary | ICD-10-CM | POA: Diagnosis not present

## 2023-06-11 DIAGNOSIS — Z01419 Encounter for gynecological examination (general) (routine) without abnormal findings: Secondary | ICD-10-CM | POA: Diagnosis not present

## 2023-06-11 NOTE — Progress Notes (Signed)
[name] 1966-03-27 [id number]   History:  57 y.o. G2P2 presents for annual exam. Postmenopausal - no HRT, no bleeding. 2019 ASCUS neg HR HPV, 2023 atrophic pattern with epithelial atypia.  2010 left breast cancer ER/PR+ managed with lumpectomy, radiation, and anti-estrogen therapy x 2 years. Right breast biopsy done yesterday. Has painful intercourse, has not been sexually active for years due to vaginal dryness with Tamoxifen. Has had painful intercourse since marriage over 30 years ago. Tried vaginal estrogen shortly with oncology approval and had headaches. 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 Having daily migraines, appointment rescheduled from yesterday due to breast biopsy.   Gynecologic History Patient's last menstrual period was 09/22/2012.   Contraception/Family planning: post menopausal status Sexually active: No  Health Maintenance Last Pap: 05/16/2022. Results were: atrophic pattern with epithelial atypia Last mammogram: 04/16/2023. Results were: Possible right breast asymmetry, ultrasound showed indeterminate mass @ 2 o'clock. Biopsy yesterday Last colonoscopy: 2021. Results were: Normal, 10-year recall Last Dexa: Never  Past medical history, past surgical history, family history and social history were all reviewed and documented in the EPIC chart. Married. SAHM, used to be pharmacist. 2 biological sons, one in [place], one in [place]. Adopted daughter, junior in HS.   ROS:  A ROS was performed and pertinent positives and negatives are included.  Exam:  Vitals:   06/11/23 1025  BP: 124/68  Pulse: 73  SpO2: 100%  Weight: 121 lb (54.9 kg)  Height: 5\' 3"  (1.6 m)    Body mass index is 21.43 kg/m.  General appearance:  Normal Thyroid:  Symmetrical, normal in size, without palpable masses or nodularity. Respiratory  Auscultation:  Clear without wheezing or rhonchi Cardiovascular  Auscultation:  Regular rate, without rubs, murmurs or  gallops  Edema/varicosities:  Not grossly evident Abdominal  Soft,nontender, without masses, guarding or rebound.  Liver/spleen:  No organomegaly noted  Hernia:  None appreciated  Skin  Inspection:  Grossly normal Breasts: Examined lying and sitting. Bilateral implants nodes  Right: Without masses, retractions, nipple discharge or axillary adenopathy.  Left: Without masses, retractions, nipple discharge or axillary adenopathy. Genitourinary   Inguinal/mons:  Normal without inguinal adenopathy  External genitalia:  Normal appearing vulva with no masses, tenderness, or lesions  BUS/Urethra/Skene's glands:  Normal  Vagina:  Normal appearing with normal color and discharge, no lesions. Atrophic changes  Cervix:  Normal appearing without discharge or lesions  Uterus:  Normal in size, shape and contour.  Midline and mobile, nontender  Adnexa/parametria:     Rt: Normal in size, without masses or tenderness.   Lt: Normal in size, without masses or tenderness.  Anus and perineum: Normal  Digital rectal exam: Not indicated  Patient informed chaperone available to be present for breast and pelvic exam. Patient has requested no chaperone to be present. Patient has been advised what will be completed during breast and pelvic exam.   Assessment/Plan:  57 y.o. G2P2 to re-establish care.   Well female exam with routine gynecological exam - Education provided on SBEs, importance of preventative screenings, current guidelines, high calcium diet, regular exercise, and multivitamin daily.  Labs done with PCP and endocrinology.   Postmenopausal - no HRT, no bleeding  Screening for cervical cancer - Plan: Cytology - PAP( [facility]). 2019 ASCUS neg HR HPV, 2023 atrophic pattern with epithelial atypia.    Screening for breast cancer - 2010 left breast cancer ER/PR+ managed with lumpectomy, radiation, and anti-estrogen therapy x 2 years. Right breast biopsy done yesterday.  Screening  for colon cancer -  2021 colonoscopy. Will repeat at 10-year interval per GI's recommendation.   Screening for osteoporosis - Recommend starting bone density screenings early due to h/o Tamoxifen use. Plans to do this at age 43 per PCP recommendation.   Return in 1 year for annual.     Olivia Mackie DNP, 10:35 AM 06/11/2023

## 2023-06-12 DIAGNOSIS — H903 Sensorineural hearing loss, bilateral: Secondary | ICD-10-CM | POA: Diagnosis not present

## 2023-06-12 DIAGNOSIS — J342 Deviated nasal septum: Secondary | ICD-10-CM | POA: Diagnosis not present

## 2023-06-12 DIAGNOSIS — H90A22 Sensorineural hearing loss, unilateral, left ear, with restricted hearing on the contralateral side: Secondary | ICD-10-CM | POA: Diagnosis not present

## 2023-06-15 DIAGNOSIS — F4323 Adjustment disorder with mixed anxiety and depressed mood: Secondary | ICD-10-CM | POA: Diagnosis not present

## 2023-06-16 ENCOUNTER — Encounter: Payer: Self-pay | Admitting: Psychiatry

## 2023-06-17 ENCOUNTER — Ambulatory Visit (INDEPENDENT_AMBULATORY_CARE_PROVIDER_SITE_OTHER): Payer: BC Managed Care – PPO

## 2023-06-17 DIAGNOSIS — G43009 Migraine without aura, not intractable, without status migrainosus: Secondary | ICD-10-CM | POA: Diagnosis not present

## 2023-06-17 DIAGNOSIS — R29818 Other symptoms and signs involving the nervous system: Secondary | ICD-10-CM

## 2023-06-17 MED ORDER — GADOBENATE DIMEGLUMINE 529 MG/ML IV SOLN
10.0000 mL | Freq: Once | INTRAVENOUS | Status: AC | PRN
  Administered 2023-06-17: 10 mL via INTRAVENOUS

## 2023-06-17 MED ORDER — METHYLPREDNISOLONE 4 MG PO TBPK: ORAL_TABLET | ORAL | 0 refills | Status: DC

## 2023-06-17 NOTE — Telephone Encounter (Signed)
I sent a short course of steroids to help with her headaches while we're working on getting her in for an appointment

## 2023-06-30 DIAGNOSIS — F4323 Adjustment disorder with mixed anxiety and depressed mood: Secondary | ICD-10-CM | POA: Diagnosis not present

## 2023-07-02 ENCOUNTER — Encounter: Payer: Self-pay | Admitting: Psychiatry

## 2023-07-03 NOTE — Telephone Encounter (Signed)
Pt picked up samples

## 2023-07-07 DIAGNOSIS — F4323 Adjustment disorder with mixed anxiety and depressed mood: Secondary | ICD-10-CM | POA: Diagnosis not present

## 2023-07-14 DIAGNOSIS — F4323 Adjustment disorder with mixed anxiety and depressed mood: Secondary | ICD-10-CM | POA: Diagnosis not present

## 2023-07-15 ENCOUNTER — Other Ambulatory Visit: Payer: Self-pay | Admitting: Psychiatry

## 2023-07-15 DIAGNOSIS — F338 Other recurrent depressive disorders: Secondary | ICD-10-CM | POA: Diagnosis not present

## 2023-07-15 DIAGNOSIS — Z23 Encounter for immunization: Secondary | ICD-10-CM | POA: Diagnosis not present

## 2023-07-15 DIAGNOSIS — R202 Paresthesia of skin: Secondary | ICD-10-CM | POA: Diagnosis not present

## 2023-07-15 DIAGNOSIS — F419 Anxiety disorder, unspecified: Secondary | ICD-10-CM | POA: Diagnosis not present

## 2023-07-15 NOTE — Telephone Encounter (Signed)
She had a reaction to Emgality (injection site reaction), so I'd like to avoid Ajovy and Aimovig if possible because they can cause the same reaction. Can we try to appeal and say she can't take CGRPs due to reaction to Emgality? Thanks!

## 2023-07-16 ENCOUNTER — Other Ambulatory Visit: Payer: Self-pay | Admitting: Family Medicine

## 2023-07-16 DIAGNOSIS — R202 Paresthesia of skin: Secondary | ICD-10-CM

## 2023-07-16 NOTE — Telephone Encounter (Signed)
Last seen on 05/18/23 Follow up scheduled on 08/11/23

## 2023-07-17 ENCOUNTER — Encounter: Payer: Self-pay | Admitting: Psychiatry

## 2023-07-21 ENCOUNTER — Other Ambulatory Visit: Payer: Self-pay | Admitting: Psychiatry

## 2023-07-21 ENCOUNTER — Encounter: Payer: Self-pay | Admitting: *Deleted

## 2023-07-21 MED ORDER — MECLIZINE HCL 12.5 MG PO TABS: 12.5000 mg | ORAL_TABLET | Freq: Three times a day (TID) | ORAL | 0 refills | Status: DC | PRN

## 2023-07-21 NOTE — Telephone Encounter (Signed)
Appeal letter written signed and faxed to [facility] APPEALS LEVEL 1 [phone]. With 2 OV. Pages 18).  QULIPTA APPEAL. REF case # [id number].  Fax confirmation received.

## 2023-07-29 MED ORDER — GABAPENTIN 300 MG PO CAPS: 300.0000 mg | ORAL_CAPSULE | Freq: Every day | ORAL | 6 refills | Status: DC

## 2023-07-30 ENCOUNTER — Telehealth: Payer: Self-pay

## 2023-07-30 ENCOUNTER — Telehealth: Payer: Self-pay | Admitting: Dietician

## 2023-07-30 ENCOUNTER — Other Ambulatory Visit (HOSPITAL_COMMUNITY): Payer: Self-pay

## 2023-07-30 MED ORDER — THYROID 90 MG PO TABS: 90.0000 mg | ORAL_TABLET | Freq: Every day | ORAL | 0 refills | Status: DC

## 2023-07-30 NOTE — Telephone Encounter (Signed)
Patient called and stated that she needs a refill on her thyroid medication.  Will send a message to the [facility] endo clinical team.

## 2023-07-30 NOTE — Telephone Encounter (Signed)
Pharmacy Patient Advocate Encounter   Received notification from CoverMyMeds that prior authorization for Qulipta 60MG  tablets is required/requested.   Insurance verification completed.   The patient is insured through [facility] .   Per test claim: PA required; PA started via CoverMyMeds. KEY [id number] . Waiting for clinical questions to populate.

## 2023-07-30 NOTE — Telephone Encounter (Signed)
Done

## 2023-07-31 ENCOUNTER — Other Ambulatory Visit: Payer: Self-pay | Admitting: Endocrinology

## 2023-07-31 NOTE — Telephone Encounter (Signed)
Clinical questions have been submitted-awaiting determination. 

## 2023-08-03 ENCOUNTER — Encounter: Payer: Self-pay | Admitting: Family Medicine

## 2023-08-04 ENCOUNTER — Ambulatory Visit: Payer: BC Managed Care – PPO | Admitting: Nurse Practitioner

## 2023-08-05 ENCOUNTER — Ambulatory Visit: Payer: BC Managed Care – PPO | Admitting: Nurse Practitioner

## 2023-08-05 ENCOUNTER — Encounter: Payer: Self-pay | Admitting: Nurse Practitioner

## 2023-08-05 ENCOUNTER — Other Ambulatory Visit (HOSPITAL_COMMUNITY): Payer: Self-pay

## 2023-08-05 NOTE — Telephone Encounter (Signed)
Pharmacy Patient Advocate Encounter  Received notification from [facility] that Prior Authorization for Qulipta 60MG  tablets has been APPROVED from 08/01/2023 to 10/23/2023. Ran test claim, Copay is $0. This test claim was processed through [facility]- copay amounts may vary at other pharmacies due to pharmacy/plan contracts, or as the patient moves through the different stages of their insurance plan.   PA #/Case ID/Reference #: PA Case ID #: [id number]

## 2023-08-06 ENCOUNTER — Ambulatory Visit (INDEPENDENT_AMBULATORY_CARE_PROVIDER_SITE_OTHER): Payer: BC Managed Care – PPO | Admitting: Obstetrics and Gynecology

## 2023-08-06 ENCOUNTER — Ambulatory Visit: Payer: BC Managed Care – PPO | Admitting: Nurse Practitioner

## 2023-08-06 ENCOUNTER — Encounter: Payer: Self-pay | Admitting: Obstetrics and Gynecology

## 2023-08-06 VITALS — BP 104/64 | HR 71

## 2023-08-06 DIAGNOSIS — Z780 Asymptomatic menopausal state: Secondary | ICD-10-CM | POA: Diagnosis not present

## 2023-08-06 DIAGNOSIS — N941 Unspecified dyspareunia: Secondary | ICD-10-CM

## 2023-08-06 DIAGNOSIS — F4323 Adjustment disorder with mixed anxiety and depressed mood: Secondary | ICD-10-CM | POA: Diagnosis not present

## 2023-08-06 MED ORDER — ESTRADIOL 0.1 MG/GM VA CREA: 1.0000 | TOPICAL_CREAM | Freq: Every day | VAGINAL | 0 refills | Status: DC

## 2023-08-06 NOTE — Addendum Note (Signed)
Addended by: Earley Favor on: 08/06/2023 05:09 PM   Modules accepted: Orders

## 2023-08-06 NOTE — Progress Notes (Signed)
57 y.o. y.o. female here for dyspareunia with entry. She has a history of breast cancer with lumpectomy and radiation. ER/PR pos  There is no height or weight on file to calculate BMI.    Patient's last menstrual period was 09/22/2012.     Blood pressure 104/64, pulse 71, last menstrual period 09/22/2012, SpO2 96%.     Component Value Date/Time   DIAGPAP  06/11/2023 1047    - Negative for intraepithelial lesion or malignancy (NILM)   DIAGPAP - Atrophic pattern with epithelial atypia (A) 05/16/2022 1414   HPVHIGH Negative 05/16/2022 1414   ADEQPAP  06/11/2023 1047    Satisfactory for evaluation; transformation zone component PRESENT.   ADEQPAP  05/16/2022 1414    Satisfactory for evaluation; transformation zone component PRESENT.    GYN HISTORY:    Component Value Date/Time   DIAGPAP  06/11/2023 1047    - Negative for intraepithelial lesion or malignancy (NILM)   DIAGPAP - Atrophic pattern with epithelial atypia (A) 05/16/2022 1414   HPVHIGH Negative 05/16/2022 1414   ADEQPAP  06/11/2023 1047    Satisfactory for evaluation; transformation zone component PRESENT.   ADEQPAP  05/16/2022 1414    Satisfactory for evaluation; transformation zone component PRESENT.    OB History  Gravida Para Term Preterm AB Living  2 2       2   SAB IAB Ectopic Multiple Live Births          2    # Outcome Date GA Lbr Len/2nd Weight Sex Type Anes PTL Lv  2 Para           1 Para             Past Medical History:  Diagnosis Date   ASCUS of cervix with negative high risk HPV 06/2018   Breast cancer (HCC) 2010   Hypothyroidism    Migraine    Personal history of radiation therapy    2011 left breast   Urgency of urination     Past Surgical History:  Procedure Laterality Date   AUGMENTATION MAMMAPLASTY Bilateral    BREAST BIOPSY Right 06/10/2023   BREAST BIOPSY Right 06/10/2023   [place] RT BREAST BX W LOC DEV 1ST LESION IMG BX SPEC US GUIDE 06/10/2023 [facility] M[facility]MO[facility]   BREAST  LUMPECTOMY Left 09/25/2009   BREAST SURGERY  2010   left   BREAST SURGERY  04/2015   CAPSULECTOMY Left 07/20/2014   Procedure: LEFT BREAST CAPSULECTOMY/REPLACE IMPLANT FOR RECONSTRUCTION OF LEFT BREAST;  Surgeon: Etter Sjogren, MD;  Location: [facility] OR;  Service: Plastics;  Laterality: Left;   DIAGNOSTIC LAPAROSCOPY  1996   Endometriosis    Current Outpatient Medications on File Prior to Visit  Medication Sig Dispense Refill   acetaminophen (TYLENOL) 650 MG CR tablet Take 650 mg by mouth every 8 (eight) hours as needed for pain.     Ascorbic Acid (VITAMIN C PO) Take 1,000 mg by mouth in the morning and at bedtime.     CALCIUM PO Take 600 mg by mouth daily.     Cholecalciferol 100 MCG (4000 UT) CAPS Take 4,000 Units by mouth daily.     cyclobenzaprine (FLEXERIL) 5 MG tablet Take 5 mg by mouth as needed for muscle spasms.     escitalopram (LEXAPRO) 10 MG tablet Take 10 mg by mouth daily.     fexofenadine (ALLEGRA ALLERGY) 180 MG tablet Take 180 mg by mouth daily.     MAGNESIUM PO Take by mouth daily.  Menatetrenone (VITAMIN K2) 100 MCG TABS Take 100 mcg by mouth daily.     Omega-3 Fatty Acids (FISH OIL) 1000 MG CAPS Take by mouth daily.     omeprazole (PRILOSEC) 20 MG capsule Take 20 mg by mouth 2 (two) times daily before a meal.     Riboflavin (VITAMIN B2 PO) Take by mouth.     Theanine 100 MG CAPS Take by mouth.     thyroid (NP THYROID) 90 MG tablet Take 1 tablet (90 mg total) by mouth daily. 90 tablet 0   traZODone (DESYREL) 50 MG tablet Take 50 mg by mouth at bedtime.     Ubrogepant (UBRELVY) 100 MG TABS TAKE 1 TABLET BY MOUTH AS NEEDED (FOR  MIGRAINES).  MAY  REPEAT  DOSE  IN  2  HOURS  IF  HEADACHE  PERSISTS 16 tablet 1   Atogepant (QULIPTA) 60 MG TABS Take 1 tablet (60 mg total) by mouth daily. (Patient not taking: Reported on 08/06/2023) 30 tablet 11   EPINEPHrine 0.3 mg/0.3 mL IJ SOAJ injection into the thigh nightly as needed. (Patient not taking: Reported on 08/06/2023)      gabapentin (NEURONTIN) 300 MG capsule Take 1 capsule (300 mg total) by mouth at bedtime. (Patient not taking: Reported on 08/06/2023) 30 capsule 6   meclizine (ANTIVERT) 12.5 MG tablet Take 1 tablet (12.5 mg total) by mouth 3 (three) times daily as needed for dizziness. (Patient not taking: Reported on 08/06/2023) 30 tablet 0   No current facility-administered medications on file prior to visit.    Social History   Socioeconomic History   Marital status: Married    Spouse name: Not on file   Number of children: Not on file   Years of education: Not on file   Highest education level: Not on file  Occupational History   Not on file  Tobacco Use   Smoking status: Never    Passive exposure: Past   Smokeless tobacco: Never  Vaping Use   Vaping status: Never Used  Substance and Sexual Activity   Alcohol use: No   Drug use: No   Sexual activity: Yes    Partners: Male    Birth control/protection: Post-menopausal    Comment: 1st intercourse 65 yo-1 partner, des  Other Topics Concern   Not on file  Social History Narrative   Not on file   Social Determinants of Health   Financial Resource Strain: Not on file  Food Insecurity: No Food Insecurity (01/28/2022)   Received from [facility], [facility]   Hunger Vital Sign    Worried About Running Out of Food in the Last Year: Never true    Ran Out of Food in the Last Year: Never true  Transportation Needs: Not on file  Physical Activity: Not on file  Stress: Not on file  Social Connections: Unknown (03/18/2022)   Received from [facility], [facility]   Social Network    Social Network: Not on file  Intimate Partner Violence: Unknown (02/06/2022)   Received from [facility], [facility]   HITS    Physically Hurt: Not on file    Insult or Talk Down To: Not on file    Threaten Physical Harm: Not on file    Scream or Curse: Not on file    Family History  Problem Relation Age of Onset   Diabetes Mother     Hyperlipidemia Mother    Colon polyps Mother    Sleep apnea Mother  Hypertension Mother    Hyperlipidemia Father    Hypertension Father    Heart disease Father    Heart failure Father    Other Father        fructose intolerant   Sleep apnea Father    Cardiomyopathy Father    Hypertension Brother    Hypertension Brother    CVA Maternal Aunt    Colon cancer Maternal Uncle    Diabetes Maternal Grandmother    Cancer - Other Maternal Grandmother    CVA Maternal Grandmother    Meniere's disease Maternal Grandmother    Hypertension Maternal Grandmother    Colon cancer Maternal Grandmother    Heart attack Maternal Grandfather    Hypertension Maternal Grandfather    Hypertension Paternal Grandmother    Heart attack Paternal Grandfather    Healthy Son    Healthy Son      Allergies  Allergen Reactions   Bupropion     Other Reaction(s): Dizziness   Topiramate Other (See Comments)    Headache   Galcanezumab-Gnlm Other (See Comments)    Rash, joint pain, sadness      Patient's last menstrual period was Patient's last menstrual period was 09/22/2012.Marland Kitchen            Review of Systems Alls systems reviewed and are negative.     Physical Exam Genitourinary:     Vulva and urethral meatus normal.     No lesions in the vagina.     Right Labia: No rash, lesions or skin changes.    Left Labia: No lesions, skin changes or rash.    Vaginal tenderness present.     No vaginal discharge.     No vaginal prolapse present.    Mild vaginal atrophy present.    Uterus is not enlarged, tender or irregular.     Uterus is anteverted.  Vitals and nursing note reviewed. Exam conducted with a chaperone present.       A:         dyspareunia                             P:         Referral to PT and to purchase cone dilators on amazon To use a short course of estrogen for PT and dilators.  TO use coconut oil as well.  No alcohol based lubricants She has been in menopause for >10 years  with out a cycle, low BMI, hypothyroidism: to get baseline dxa with risk factors Encouraged Vit D and Calcium   No follow-ups on file.  Earley Favor

## 2023-08-07 ENCOUNTER — Ambulatory Visit
Admission: RE | Admit: 2023-08-07 | Discharge: 2023-08-07 | Disposition: A | Discharge status: Home / Self Care | Payer: BC Managed Care – PPO | Source: Ambulatory Visit | Attending: Family Medicine | Admitting: Family Medicine

## 2023-08-07 DIAGNOSIS — R202 Paresthesia of skin: Secondary | ICD-10-CM

## 2023-08-07 DIAGNOSIS — M47812 Spondylosis without myelopathy or radiculopathy, cervical region: Secondary | ICD-10-CM | POA: Diagnosis not present

## 2023-08-07 DIAGNOSIS — M4802 Spinal stenosis, cervical region: Secondary | ICD-10-CM | POA: Diagnosis not present

## 2023-08-11 ENCOUNTER — Ambulatory Visit: Payer: BC Managed Care – PPO | Admitting: Adult Health

## 2023-08-13 ENCOUNTER — Other Ambulatory Visit: Payer: Self-pay

## 2023-08-18 ENCOUNTER — Encounter: Payer: Self-pay | Admitting: Adult Health

## 2023-08-18 MED ORDER — UBRELVY 100 MG PO TABS: 100.0000 mg | ORAL_TABLET | ORAL | 1 refills | Status: AC | PRN

## 2023-08-20 ENCOUNTER — Ambulatory Visit: Payer: BC Managed Care – PPO | Attending: Obstetrics and Gynecology | Admitting: Physical Therapy

## 2023-08-20 ENCOUNTER — Other Ambulatory Visit: Payer: Self-pay

## 2023-08-20 ENCOUNTER — Encounter: Payer: Self-pay | Admitting: Physical Therapy

## 2023-08-20 DIAGNOSIS — M62838 Other muscle spasm: Secondary | ICD-10-CM | POA: Insufficient documentation

## 2023-08-20 DIAGNOSIS — N941 Unspecified dyspareunia: Secondary | ICD-10-CM | POA: Insufficient documentation

## 2023-08-20 DIAGNOSIS — R278 Other lack of coordination: Secondary | ICD-10-CM | POA: Diagnosis not present

## 2023-08-20 NOTE — Therapy (Signed)
OUTPATIENT PHYSICAL THERAPY FEMALE PELVIC EVALUATION   Patient Name: [name] MRN: [id number] DOB:[birth year], 57 y.o., female Today's Date: 08/20/2023  END OF SESSION:  PT End of Session - 08/20/23 1436     Visit Number 1    PT Start Time 1030    PT Stop Time 1130    PT Time Calculation (min) 60 min    Activity Tolerance Patient tolerated treatment well    Behavior During Therapy [facility] for tasks assessed/performed             Past Medical History:  Diagnosis Date   ASCUS of cervix with negative high risk HPV 06/2018   Breast cancer (HCC) 2010   Hypothyroidism    Migraine    Personal history of radiation therapy    2011 left breast   Urgency of urination    Past Surgical History:  Procedure Laterality Date   AUGMENTATION MAMMAPLASTY Bilateral    BREAST BIOPSY Right 06/10/2023   BREAST BIOPSY Right 06/10/2023   [place] RT BREAST BX W LOC DEV 1ST LESION IMG BX SPEC US GUIDE 06/10/2023 [facility]AMMOGRAPHY   BREAST LUMPECTOMY Left 09/25/2009   BREAST SURGERY  2010   left   BREAST SURGERY  04/2015   CAPSULECTOMY Left 07/20/2014   Procedure: LEFT BREAST CAPSULECTOMY/REPLACE IMPLANT FOR RECONSTRUCTION OF LEFT BREAST;  Surgeon: Etter Sjogren, MD;  Location: [facility] OR;  Service: Plastics;  Laterality: Left;   DIAGNOSTIC LAPAROSCOPY  1996   Endometriosis   Patient Active Problem List   Diagnosis Date Noted   Closed fracture of right distal radius 05/28/2020   Sleep apnea 12/19/2019   Migraine without aura and without status migrainosus, not intractable 12/09/2018   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   Deviated septum 01/05/2018   Nasal deformity 01/05/2018   Nasal obstruction 01/05/2018   Headache around the eyes 10/06/2017   Chronic rhinitis 10/06/2017   Seasonal allergic rhinitis 10/06/2017   Breast cancer of upper-inner quadrant of left female breast (HCC) 12/02/2013    PCP: Laurann Montana, MD PCP - General  REFERRING PROVIDER: Earley Favor, MD   REFERRING DIAG:  N94.10 (ICD-10-CM) - Dyspareunia, female  THERAPY DIAG:  Other muscle spasm - Plan: PT plan of care cert/re-cert  Other lack of coordination - Plan: PT plan of care cert/re-cert  Rationale for Evaluation and Treatment: Rehabilitation  ONSET DATE: 1986  SUBJECTIVE:                                                                                                                                                                                           SUBJECTIVE STATEMENT: Pt reports that she  has had pain with intercourse since she got married in 1986, she is so tight down there that her husband would ejaculate in 2 mins and she was glad it was over. She got dried up on Tamoxifen when she had breast cancer. She has not had intercourse in 8 years d/t pain. When she was on Tamoxifen she felt like she was getting raped, she could not walk for 2-3 days after. Bought dilators, is doing estradiol cream 3 times/ week, she freezes coconut oil and inserts it vaginally to help with dilators. She is now putting the smallest one in 9 pm and takes it out 5-6 in the morning, sleeps with it but has 5/10 pain.   Has had constipation since she was a young child. Endometriosis as well, reports that her uterus is pushed to the left.     Fluid intake: Yes:      PAIN:  Are you having pain? Yes NPRS scale: 5/10 Pain location: Internal, External, Deep, Right, and Vaginal  Pain type: burning, sharp, and tight intense Pain description: intermittent and burning   Aggravating factors: dilators Relieving factors: no dilators  PRECAUTIONS: None  RED FLAGS: None   WEIGHT BEARING RESTRICTIONS: No  FALLS:  Has patient fallen in last 6 months? 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  PLOF: Independent  PATIENT GOALS: to be abl eto have intercourse  again without pain  PERTINENT HISTORY:  Breast cancer, endometriosis Sexual abuse: No  BOWEL MOVEMENT: Has had constipations since she was a young child  Pain with bowel movement: Yes Type of bowel movement:Type (Bristol Stool Scale) 1 Fully empty rectum: Yes:   Leakage: no Pads: No Fiber supplement: No  URINATION: Pain with urination: No Fully empty bladder: Yes: does not know Stream:  does not know Urgency: Yes:   Frequency: yes-when she has a migraine- she will go every 10- 15 mins, longest she goes is 1.5 hours Leakage: Urge to void, Walking to the bathroom, Coughing, Sneezing, Laughing, Exercise, Lifting, Bending forward, and Intercourse Pads: yes- 1/day thicker poise pads  INTERCOURSE: Pain with intercourse: Initial Penetration, During Penetration, After Intercourse, and Pain Interrupts Intercourse- used to Ability to have vaginal penetration:  No Climax: no Marinoff Scale: 3/3  PREGNANCY: Vaginal deliveries 2 Tearing Yes:   C-section deliveries 0 Currently pregnant No  PROLAPSE: None   OBJECTIVE:  Note: Objective measures were completed at Evaluation unless otherwise noted.    COGNITION: Overall cognitive status: Within functional limits for tasks assessed     SENSATION: Light touch: Appears intact Proprioception: Appears intact   POSTURE: No Significant postural limitations  PELVIC ALIGNMENT: within functional limitations   LUMBARAROM/PROM:  A/PROM A/PROM  eval  Flexion Palms on floor  Extension   Right lateral flexion   Left lateral flexion   Right rotation   Left rotation    (Blank rows = not tested)  LOWER EXTREMITY ROM:  Active ROM Right eval Left eval  Hip flexion    Hip extension    Hip abduction    Hip adduction    Hip internal rotation    Hip external rotation    Knee flexion    Knee extension    Ankle dorsiflexion    Ankle plantarflexion    Ankle inversion    Ankle eversion     (Blank rows = not tested)  LOWER  EXTREMITY MMT: grossly within functional limitations, some hypermobility present throughout  MMT Right eval Left eval  Hip flexion    Hip extension    Hip abduction    Hip adduction    Hip internal rotation    Hip external rotation    Knee flexion    Knee extension    Ankle dorsiflexion    Ankle plantarflexion    Ankle inversion    Ankle eversion     PALPATION:   General  tight and tender layer one vaginally and rectally                 External Perineal Exam within functional limitations                              Internal Pelvic Floor tenderness to touch throughout,  difficulty with bulging,  stool in rectum,  tight and tender external and internal anal sphincters  Short vaginal canal   Patient confirms identification and approves PT to assess internal pelvic floor and treatment Yes  PELVIC MMT:   MMT eval  Vaginal 3/5  Internal Anal Sphincter 4/5  External Anal Sphincter   Puborectalis   Diastasis Recti none  (Blank rows = not tested)        TONE: high  PROLAPSE: no  TODAY'S TREATMENT:                                                                                                                              DATE: 08/20/23   EVAL see above There acts- abdominal massage- performed and patient educated on, stretches, diaphragmatic breathing for pelvic floor down training  PATIENT EDUCATION:  Education details: dilators, lubricant, perineal massage, ohnuts, relevant anatomy Person educated: Patient Education method: Explanation, Demonstration, Tactile cues, Verbal cues, and Handouts Education comprehension: verbalized understanding and needs further education  HOME EXERCISE PROGRAM: Next visit  ASSESSMENT:  CLINICAL IMPRESSION: Patient is a 57 y.o. F who was seen today for physical therapy evaluation and treatment for dyspareunia. She presents with short vaginal canal, tight and tender pelvic floor muscles, upper chest breathing ad decreased ability  to lengthen and relax her abdomen and pelvic floor. She did well with internal pelvic floor assessment and  education and will benefit from PT to reduce pain with intercourse and improve QOL.   OBJECTIVE IMPAIRMENTS: decreased coordination, decreased knowledge of condition, decreased ROM, increased fascial restrictions, increased muscle spasms, impaired tone, and pain.   ACTIVITY LIMITATIONS: continence  PARTICIPATION LIMITATIONS: interpersonal relationship  PERSONAL FACTORS: Time since onset of injury/illness/exacerbation are also affecting patient's functional outcome.   REHAB POTENTIAL: Good  CLINICAL DECISION MAKING: Evolving/moderate complexity  EVALUATION COMPLEXITY: Moderate   GOALS: Goals reviewed with patient? Yes  SHORT TERM GOALS: Target date: 09/17/2023    Pt will be independent with HEP.   Baseline: Goal status: INITIAL  2.  Pt will be I  with dilators Baseline:  Goal status: INITIAL  3.  Pt will be I with abdominal massage and use squatty potty to empty bowels better. Baseline:  Goal status: INITIAL   LONG TERM GOALS: Target date: 11/12/2023    Pt will be independent with advanced HEP.   Baseline:  Goal status: INITIAL  2.  Pt will report max 2/10 pain with intercourse Baseline:  Goal status: INITIAL  3.  Pt will soak 0 pads/ day Baseline:  Goal status: INITIAL  PLAN:  PT FREQUENCY: 1-2x/week  PT DURATION: 12 weeks  PLANNED INTERVENTIONS: 97110-Therapeutic exercises, 97530- Therapeutic activity, 97112- Neuromuscular re-education, 97535- Self Care, 16109- Manual therapy, Dry Needling, Joint mobilization, Spinal manipulation, Manual lymph drainage, and Scar mobilization  PLAN FOR NEXT SESSION: urge suppression training, Diaphragmatic breathing reed    Kamalei Roeder, PT 08/20/23 2:40 PM

## 2023-08-20 NOTE — Patient Instructions (Addendum)
Dilators: 1. Find a comfortable position and work on diaphragmatic breathing: you can lie on your back propped up on pillows and knees supported as a good first position to try 2. Start with your finger and a little bit of lubricant: gentle massage the vaginal entrance before use of dilator 3. Wash dilators before and after you use them; then place a little bit of lubricant along the dilator on all sides 4. Gently press the very tip of the dilator into the vagina - stop with any pain or discomfort 5. First goal is to get dilator inside vagina: you can use the back and forth (side to side) movement to help gently press dilator forward. 6. Once the dilator is inside the vaginal canal, just lie there and take some deep breaths, read something, listen to music, etc - distract yourself! 7. Ways to progress: a. Bigger side to side b. Tilting c. Twisting d. Thrusting - does not have to be the length of the dilator at first - start with just a small movement 8. If you get stuck and there's pain: leave the dilator where it is and try to ignore it - distract yourself and work on breathing. If you cannot get a dilator comfortable, go down in size. The primary goal is to make this a positive experience and end on a good note.

## 2023-08-21 ENCOUNTER — Other Ambulatory Visit (HOSPITAL_COMMUNITY): Payer: Self-pay

## 2023-08-21 ENCOUNTER — Telehealth: Payer: Self-pay

## 2023-08-21 NOTE — Telephone Encounter (Signed)
*  [facility]  Pharmacy Patient Advocate Encounter   Received notification from CoverMyMeds that prior authorization for Ubrelvy 100MG  tablets  is required/requested.   Insurance verification completed.   The patient is insured through [facility] .   Per test claim: PA required; PA submitted to [facility] via CoverMyMeds Key/confirmation #/EOC [id number] Status is pending

## 2023-08-25 DIAGNOSIS — H903 Sensorineural hearing loss, bilateral: Secondary | ICD-10-CM | POA: Diagnosis not present

## 2023-08-28 NOTE — Telephone Encounter (Signed)
Authorization On File.Authorization On File.Please note that authorization is already on file for the requested medication and is effective through (02/19/2023 09:19 under request- [id number]), Please note, a paid claim on 08/18/2023 for UBRELVY TAB 100MG . Thank you

## 2023-09-01 DIAGNOSIS — M5412 Radiculopathy, cervical region: Secondary | ICD-10-CM | POA: Diagnosis not present

## 2023-09-08 ENCOUNTER — Ambulatory Visit: Payer: BC Managed Care – PPO | Attending: Obstetrics and Gynecology | Admitting: Physical Therapy

## 2023-09-08 DIAGNOSIS — R278 Other lack of coordination: Secondary | ICD-10-CM | POA: Diagnosis not present

## 2023-09-08 DIAGNOSIS — M62838 Other muscle spasm: Secondary | ICD-10-CM | POA: Diagnosis not present

## 2023-09-08 NOTE — Therapy (Signed)
OUTPATIENT PHYSICAL THERAPY FEMALE PELVIC EVALUATION   Patient Name: [name] MRN: [id number] DOB:1966/06/06, 57 y.o., female Today's Date: 09/08/2023  END OF SESSION:  PT End of Session - 09/08/23 1328     Visit Number 2    PT Start Time 1150    PT Stop Time 1230    PT Time Calculation (min) 40 min    Activity Tolerance Patient tolerated treatment well    Behavior During Therapy [facility] for tasks assessed/performed              Past Medical History:  Diagnosis Date   ASCUS of cervix with negative high risk HPV 06/2018   Breast cancer (HCC) 2010   Hypothyroidism    Migraine    Personal history of radiation therapy    2011 left breast   Urgency of urination    Past Surgical History:  Procedure Laterality Date   AUGMENTATION MAMMAPLASTY Bilateral    BREAST BIOPSY Right 06/10/2023   BREAST BIOPSY Right 06/10/2023   [place] RT BREAST BX W LOC DEV 1ST LESION IMG BX SPEC US GUIDE 06/10/2023 [facility] MAMMOGRAPHY   BREAST LUMPECTOMY Left 09/25/2009   BREAST SURGERY  2010   left   BREAST SURGERY  04/2015   CAPSULECTOMY Left 07/20/2014   Procedure: LEFT BREAST CAPSULECTOMY/REPLACE IMPLANT FOR RECONSTRUCTION OF LEFT BREAST;  Surgeon: Etter Sjogren, MD;  Location: [facility] OR;  Service: Plastics;  Laterality: Left;   DIAGNOSTIC LAPAROSCOPY  1996   Endometriosis   Patient Active Problem List   Diagnosis Date Noted   Closed fracture of right distal radius 05/28/2020   Sleep apnea 12/19/2019   Migraine without aura and without status migrainosus, not intractable 12/09/2018   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   Deviated septum 01/05/2018   Nasal deformity 01/05/2018   Nasal obstruction 01/05/2018   Headache around the eyes 10/06/2017   Chronic rhinitis 10/06/2017   Seasonal allergic rhinitis 10/06/2017   Breast cancer of upper-inner quadrant of left female breast (HCC) 12/02/2013    PCP: Laurann Montana, MD PCP - General  REFERRING PROVIDER: Earley Favor, MD   REFERRING  DIAG: N94.10 (ICD-10-CM) - Dyspareunia, female  THERAPY DIAG:  Other muscle spasm  Other lack of coordination  Rationale for Evaluation and Treatment: Rehabilitation  ONSET DATE: 1986  SUBJECTIVE:                                                                                                                                                                                           SUBJECTIVE STATEMENT: Pt reports that she has been busy, husband had to have an emergency surgery, Able to do  a bigger dilator- yellow ( like a IR size 4). Last appt 3 weeks ago. She is awaiting PT for neck surgery before they do neck surgery.    Pt reports that she has had pain with intercourse since she got married in 1986, she is so tight down there that her husband would ejaculate in 2 mins and she was glad it was over. She got dried up on Tamoxifen when she had breast cancer. She has not had intercourse in 8 years d/t pain. When she was on Tamoxifen she felt like she was getting raped, she could not walk for 2-3 days after. Bought dilators, is doing estradiol cream 3 times/ week, she freezes coconut oil and inserts it vaginally to help with dilators. She is now putting the smallest one in 9 pm and takes it out 5-6 in the morning, sleeps with it but has 5/10 pain.   Has had constipation since she was a young child. Endometriosis as well, reports that her uterus is pushed to the left.     Fluid intake: Yes:      PAIN:  Are you having pain? Yes NPRS scale: 5/10 Pain location: Internal, External, Deep, Right, and Vaginal  Pain type: burning, sharp, and tight intense Pain description: intermittent and burning   Aggravating factors: dilators Relieving factors: no dilators  PRECAUTIONS: None  RED FLAGS: None   WEIGHT BEARING RESTRICTIONS: No  FALLS:  Has patient fallen in last 6 months? 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  PLOF: Independent  PATIENT GOALS: to be abl eto have intercourse again without pain  PERTINENT HISTORY:  Breast cancer, endometriosis Sexual abuse: No  BOWEL MOVEMENT: Has had constipations since she was a young child  Pain with bowel movement: Yes Type of bowel movement:Type (Bristol Stool Scale) 1 Fully empty rectum: Yes:   Leakage: no Pads: No Fiber supplement: No  URINATION: Pain with urination: No Fully empty bladder: Yes: does not know Stream:  does not know Urgency: Yes:   Frequency: yes-when she has a migraine- she will go every 10- 15 mins, longest she goes is 1.5 hours Leakage: Urge to void, Walking to the bathroom, Coughing, Sneezing, Laughing, Exercise, Lifting, Bending forward, and Intercourse Pads: yes- 1/day thicker poise pads  INTERCOURSE: Pain with intercourse: Initial Penetration, During Penetration, After Intercourse, and Pain Interrupts Intercourse- used to Ability to have vaginal penetration:  No Climax: no Marinoff Scale: 3/3  PREGNANCY: Vaginal deliveries 2 Tearing Yes:   C-section deliveries 0 Currently pregnant No  PROLAPSE: None   OBJECTIVE:  Note: Objective measures were completed at Evaluation unless otherwise noted.    COGNITION: Overall cognitive status: Within functional limits for tasks assessed     SENSATION: Light touch: Appears intact Proprioception: Appears intact   POSTURE: No Significant postural limitations  PELVIC ALIGNMENT: within functional limitations   LUMBARAROM/PROM:  A/PROM A/PROM  eval  Flexion Palms on floor  Extension   Right lateral flexion   Left lateral flexion   Right rotation   Left rotation    (Blank rows = not tested)  LOWER EXTREMITY ROM:  Active ROM Right eval Left eval  Hip flexion    Hip extension    Hip abduction    Hip adduction    Hip internal rotation    Hip external  rotation    Knee flexion  Knee extension    Ankle dorsiflexion    Ankle plantarflexion    Ankle inversion    Ankle eversion     (Blank rows = not tested)  LOWER EXTREMITY MMT: grossly within functional limitations, some hypermobility present throughout  MMT Right eval Left eval  Hip flexion    Hip extension    Hip abduction    Hip adduction    Hip internal rotation    Hip external rotation    Knee flexion    Knee extension    Ankle dorsiflexion    Ankle plantarflexion    Ankle inversion    Ankle eversion     PALPATION:   General  tight and tender layer one vaginally and rectally                 External Perineal Exam within functional limitations                              Internal Pelvic Floor tenderness to touch throughout,  difficulty with bulging,  stool in rectum,  tight and tender external and internal anal sphincters  Short vaginal canal   Patient confirms identification and approves PT to assess internal pelvic floor and treatment Yes  PELVIC MMT:   MMT eval  Vaginal 3/5  Internal Anal Sphincter 4/5  External Anal Sphincter   Puborectalis   Diastasis Recti none  (Blank rows = not tested)        TONE: high  PROLAPSE: no  TODAY'S TREATMENT:                                                                                                                              DATE: 09/08/23   Manual- pelvic floor release/ perineal massage demonstrated and pt educated on    Therapeutic activities- education on perineal massage, dilators, lubricant, knee fallout with dilator work, DB, ohnut     EVAL see above There acts- abdominal massage- performed and patient educated on, stretches, diaphragmatic breathing for pelvic floor down training  PATIENT EDUCATION:  Education details: dilators, lubricant, perineal massage, ohnuts, relevant anatomy Person educated: Patient Education method: Explanation, Demonstration, Tactile cues, Verbal cues, and  Handouts Education comprehension: verbalized understanding and needs further education  HOME EXERCISE PROGRAM:   ASSESSMENT:  CLINICAL IMPRESSION: Tx focus- pelvic floor manual therapy. Pt with weak pelvic floor and superficial pelvic floor muscle tenderness and tender tight scar. Progressing ok with dilators. Some anterior vaginal wall laxity checked in standing and supine. Pt educated on ohnut, lubricants, relevant anatomy, exercises to reduce UI. Pt able to come inconsistently d/t travel in the next month or so.   Patient is a 57 y.o. F who was seen today for physical therapy evaluation and treatment for dyspareunia. She presents with short vaginal canal, tight and tender pelvic floor muscles, upper chest breathing ad decreased ability to lengthen and relax her abdomen and  pelvic floor. She did well with internal pelvic floor assessment and  education and will benefit from PT to reduce pain with intercourse and improve QOL.   OBJECTIVE IMPAIRMENTS: decreased coordination, decreased knowledge of condition, decreased ROM, increased fascial restrictions, increased muscle spasms, impaired tone, and pain.   ACTIVITY LIMITATIONS: continence  PARTICIPATION LIMITATIONS: interpersonal relationship  PERSONAL FACTORS: Time since onset of injury/illness/exacerbation are also affecting patient's functional outcome.   REHAB POTENTIAL: Good  CLINICAL DECISION MAKING: Evolving/moderate complexity  EVALUATION COMPLEXITY: Moderate   GOALS: Goals reviewed with patient? Yes  SHORT TERM GOALS: Target date: 09/17/2023    Pt will be independent with HEP.   Baseline: Goal status: INITIAL  2.  Pt will be I with dilators Baseline:  Goal status: INITIAL  3.  Pt will be I with abdominal massage and use squatty potty to empty bowels better. Baseline:  Goal status: INITIAL   LONG TERM GOALS: Target date: 11/12/2023    Pt will be independent with advanced HEP.   Baseline:  Goal status:  INITIAL  2.  Pt will report max 2/10 pain with intercourse Baseline:  Goal status: INITIAL  3.  Pt will soak 0 pads/ day Baseline:  Goal status: INITIAL  PLAN:  PT FREQUENCY: 1-2x/week  PT DURATION: 12 weeks  PLANNED INTERVENTIONS: 97110-Therapeutic exercises, 97530- Therapeutic activity, 97112- Neuromuscular re-education, 97535- Self Care, 16109- Manual therapy, Dry Needling, Joint mobilization, Spinal manipulation, Manual lymph drainage, and Scar mobilization  PLAN FOR NEXT SESSION: urge suppression training, Diaphragmatic breathing reed    Homar Weinkauf, PT 09/08/23 1:28 PM

## 2023-09-23 DIAGNOSIS — H903 Sensorineural hearing loss, bilateral: Secondary | ICD-10-CM | POA: Diagnosis not present

## 2023-10-06 ENCOUNTER — Other Ambulatory Visit: Payer: Self-pay | Admitting: Obstetrics and Gynecology

## 2023-10-06 NOTE — Telephone Encounter (Signed)
Medication refill request: estrace  Last AEX:  06/11/23 Next AEX: not scheduled  Last MMG (if hormonal medication request): 05/26/23 Refill authorized: #[id number] with no rf

## 2023-10-14 DIAGNOSIS — G43909 Migraine, unspecified, not intractable, without status migrainosus: Secondary | ICD-10-CM | POA: Diagnosis not present

## 2023-10-14 DIAGNOSIS — F419 Anxiety disorder, unspecified: Secondary | ICD-10-CM | POA: Diagnosis not present

## 2023-10-14 DIAGNOSIS — F338 Other recurrent depressive disorders: Secondary | ICD-10-CM | POA: Diagnosis not present

## 2023-10-14 DIAGNOSIS — G4700 Insomnia, unspecified: Secondary | ICD-10-CM | POA: Diagnosis not present

## 2023-10-15 DIAGNOSIS — M5412 Radiculopathy, cervical region: Secondary | ICD-10-CM | POA: Diagnosis not present

## 2023-10-15 DIAGNOSIS — F4323 Adjustment disorder with mixed anxiety and depressed mood: Secondary | ICD-10-CM | POA: Diagnosis not present

## 2023-10-20 DIAGNOSIS — M5412 Radiculopathy, cervical region: Secondary | ICD-10-CM | POA: Diagnosis not present

## 2023-10-25 ENCOUNTER — Other Ambulatory Visit: Payer: Self-pay | Admitting: Endocrinology

## 2023-10-30 DIAGNOSIS — H903 Sensorineural hearing loss, bilateral: Secondary | ICD-10-CM | POA: Diagnosis not present

## 2023-10-30 DIAGNOSIS — M5412 Radiculopathy, cervical region: Secondary | ICD-10-CM | POA: Diagnosis not present

## 2023-11-06 ENCOUNTER — Ambulatory Visit: Payer: BC Managed Care – PPO | Admitting: Physical Therapy

## 2023-11-10 ENCOUNTER — Encounter: Payer: Self-pay | Admitting: Physical Therapy

## 2023-11-10 ENCOUNTER — Ambulatory Visit: Payer: BC Managed Care – PPO | Attending: Obstetrics and Gynecology | Admitting: Physical Therapy

## 2023-11-10 DIAGNOSIS — R278 Other lack of coordination: Secondary | ICD-10-CM | POA: Diagnosis not present

## 2023-11-10 DIAGNOSIS — M62838 Other muscle spasm: Secondary | ICD-10-CM | POA: Diagnosis not present

## 2023-11-10 DIAGNOSIS — M5412 Radiculopathy, cervical region: Secondary | ICD-10-CM | POA: Diagnosis not present

## 2023-11-10 NOTE — Therapy (Signed)
 OUTPATIENT PHYSICAL THERAPY FEMALE PELVIC TREATMENT Patient Name: [name] MRN: [id number] DOB:06/29/1966, 58 y.o., female Today's Date: 11/10/2023  END OF SESSION:  PT End of Session - 11/10/23 0948     Visit Number 3    Authorization Type bcbs 2024  DED/OOP MET  no auth req    PT Start Time 0800    PT Stop Time 0845    PT Time Calculation (min) 45 min    Activity Tolerance Patient tolerated treatment well    Behavior During Therapy [facility] for tasks assessed/performed               Past Medical History:  Diagnosis Date   ASCUS of cervix with negative high risk HPV 06/2018   Breast cancer (HCC) 2010   Hypothyroidism    Migraine    Personal history of radiation therapy    2011 left breast   Urgency of urination    Past Surgical History:  Procedure Laterality Date   AUGMENTATION MAMMAPLASTY Bilateral    BREAST BIOPSY Right 06/10/2023   BREAST BIOPSY Right 06/10/2023   US  RT BREAST BX W LOC DEV 1ST LESION IMG BX SPEC US  GUIDE 06/10/2023 [facility] MAMMOGRAPHY   BREAST LUMPECTOMY Left 09/25/2009   BREAST SURGERY  2010   left   BREAST SURGERY  04/2015   CAPSULECTOMY Left 07/20/2014   Procedure: LEFT BREAST CAPSULECTOMY/REPLACE IMPLANT FOR RECONSTRUCTION OF LEFT BREAST;  Surgeon: Alm Sick, MD;  Location: [facility] OR;  Service: Plastics;  Laterality: Left;   DIAGNOSTIC LAPAROSCOPY  1996   Endometriosis   Patient Active Problem List   Diagnosis Date Noted   Closed fracture of right distal radius 05/28/2020   Sleep apnea 12/19/2019   Migraine without aura and without status migrainosus, not intractable 12/09/2018   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   Deviated septum 01/05/2018   Nasal deformity 01/05/2018   Nasal obstruction 01/05/2018   Headache around the eyes 10/06/2017   Chronic rhinitis 10/06/2017   Seasonal allergic rhinitis 10/06/2017   Breast cancer of upper-inner quadrant of left female breast (HCC) 12/02/2013    PCP: Teresa Channel, MD PCP -  General  REFERRING PROVIDER: Glennon Almarie POUR, MD   REFERRING DIAG: N94.10 (ICD-10-CM) - Dyspareunia, female  THERAPY DIAG:  Other muscle spasm  Other lack of coordination  Rationale for Evaluation and Treatment: Rehabilitation  ONSET DATE: 1986  SUBJECTIVE:                                                                                                                                                                                           SUBJECTIVE STATEMENT: Pt reports that she  has been busy and travelling, has not been able to do the things she was supposed to do d/t lack of privacy.  ( Previous appts in October 10/17 and 11/5) Not sure if she is supposed to be here today. Doing some PT for her neck. Trying to avoid surgery Pt reports that she is constipated, goes in cycle. There might be something wrong with hormones. Urine leaking- all the time. Lubricants work better than coconut oil, she has been doing that with dilators.   Fluid intake: Yes:      PAIN:  Are you having pain? Yes NPRS scale: 5/10 Pain location: Internal, External, Deep, Right, and Vaginal  Pain type: burning, sharp, and tight intense Pain description: intermittent and burning   Aggravating factors: dilators Relieving factors: no dilators  PRECAUTIONS: None  RED FLAGS: None   WEIGHT BEARING RESTRICTIONS: No  FALLS:  Has patient fallen in last 6 months? 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  PLOF: Independent  PATIENT GOALS: to be abl eto have intercourse again without pain  PERTINENT HISTORY:  Breast cancer, endometriosis Sexual abuse: No  BOWEL MOVEMENT: Has had constipations since she was a young child  Pain with bowel movement: Yes Type of bowel movement:Type (Bristol Stool Scale) 1 Fully empty rectum: Yes:    Leakage: no Pads: No Fiber supplement: No  URINATION: Pain with urination: No Fully empty bladder: Yes: does not know Stream:  does not know Urgency: Yes:   Frequency: yes-when she has a migraine- she will go every 10- 15 mins, longest she goes is 1.5 hours Leakage: Urge to void, Walking to the bathroom, Coughing, Sneezing, Laughing, Exercise, Lifting, Bending forward, and Intercourse Pads: yes- 1/day thicker poise pads  INTERCOURSE: Pain with intercourse: Initial Penetration, During Penetration, After Intercourse, and Pain Interrupts Intercourse- used to Ability to have vaginal penetration:  No Climax: no Marinoff Scale: 3/3  PREGNANCY: Vaginal deliveries 2 Tearing Yes:   C-section deliveries 0 Currently pregnant No  PROLAPSE: None   OBJECTIVE:  Note: Objective measures were completed at Evaluation unless otherwise noted.    COGNITION: Overall cognitive status: Within functional limits for tasks assessed     SENSATION: Light touch: Appears intact Proprioception: Appears intact   POSTURE: No Significant postural limitations  PELVIC ALIGNMENT: within functional limitations   LUMBARAROM/PROM:  A/PROM A/PROM  eval  Flexion Palms on floor  Extension   Right lateral flexion   Left lateral flexion   Right rotation   Left rotation    (Blank rows = not tested)  LOWER EXTREMITY ROM: grossly within functional limitations   LOWER EXTREMITY MMT: grossly within functional limitations, some hypermobility present throughout   PALPATION:   General  tight and tender layer one vaginally and rectally                 External Perineal Exam within functional limitations                              Internal Pelvic Floor tenderness to touch throughout,  difficulty with bulging,  stool in rectum,  tight and tender external and internal anal sphincters  Short vaginal canal   Patient confirms identification and approves PT to assess internal pelvic floor and  treatment Yes  PELVIC MMT:   MMT eval  Vaginal 3/5  Internal Anal Sphincter 4/5  External Anal Sphincter   Puborectalis   Diastasis Recti none  (Blank rows = not tested)        TONE: high  PROLAPSE: no  TODAY'S TREATMENT:                                                                                                                              DATE: 11/10/23   Manual- superficial layer pelvic floor release performed and pt educated on Discussed non painful touch to retrain brain- try 2-3 minutes at a time     Neuro reed- diaphragmatic breathing with thera band around lower ribs, reverse kegel ( not able to)    PATIENT EDUCATION:  Education details: dilators, lubricant, perineal massage, ohnuts, relevant anatomy Person educated: Patient Education method: Explanation, Demonstration, Tactile cues, Verbal cues, and Handouts Education comprehension: verbalized understanding and needs further education  HOME EXERCISE PROGRAM:[id number]   ASSESSMENT:  CLINICAL IMPRESSION: Pt with difficulty with reverse kegel, tends to contract and hold breath, dem upper chest breathing as a habit.  Pt with decent pelvic floor strength Pt to try to focus on consistency with HEP, perineal massage, downtraining of PF muscles to reduce pain with intercourse. Added constipation management to HEP. Pt will benefit from more consistent HEP which we discussed.        OBJECTIVE IMPAIRMENTS: decreased coordination, decreased knowledge of condition, decreased ROM, increased fascial restrictions, increased muscle spasms, impaired tone, and pain.   ACTIVITY LIMITATIONS: continence  PARTICIPATION LIMITATIONS: interpersonal relationship  PERSONAL FACTORS: Time since onset of injury/illness/exacerbation are also affecting patient's functional outcome.   REHAB POTENTIAL: Good  CLINICAL DECISION MAKING: Evolving/moderate complexity  EVALUATION COMPLEXITY: Moderate   GOALS: Goals reviewed with  patient? Yes  SHORT TERM GOALS: Target date: 09/16/2024- updated 11/09/2022 -12/08/2023      Pt will be independent with HEP.   Baseline: Goal status: INITIAL  2.  Pt will be I with dilators Baseline:  Goal status: INITIAL  3.  Pt will be I with abdominal massage and use squatty potty to empty bowels better. Baseline:  Goal status: INITIAL   LONG TERM GOALS: Target date: 11/12/2023- updated 11/10/2023-02/02/2024      Pt will be independent with advanced HEP.   Baseline:  Goal status: INITIAL  2.  Pt will report max 2/10 pain with intercourse Baseline:  Goal status: INITIAL  3.  Pt will soak 0 pads/ day Baseline:  Goal status: INITIAL  PLAN:  PT FREQUENCY: 1-2x/week  PT DURATION: 12 weeks  PLANNED INTERVENTIONS: 97110-Therapeutic exercises, 97530- Therapeutic activity, 97112- Neuromuscular re-education, 97535- Self Care, [id number]- Manual therapy, Dry Needling, Joint mobilization, Spinal manipulation, Manual lymph drainage, and Scar mobilization  PLAN FOR NEXT SESSION: downtraining, diaphragmatic breathing    Jeane Cashatt, PT 11/10/23 9:50 AM

## 2023-11-11 ENCOUNTER — Encounter (HOSPITAL_BASED_OUTPATIENT_CLINIC_OR_DEPARTMENT_OTHER): Payer: Self-pay | Admitting: Emergency Medicine

## 2023-11-11 ENCOUNTER — Telehealth: Payer: Self-pay | Admitting: Neurology

## 2023-11-11 ENCOUNTER — Emergency Department (HOSPITAL_BASED_OUTPATIENT_CLINIC_OR_DEPARTMENT_OTHER)
Admission: EM | Admit: 2023-11-11 | Discharge: 2023-11-11 | Discharge status: Against Medical Advice | Payer: BC Managed Care – PPO | Attending: Emergency Medicine | Admitting: Emergency Medicine

## 2023-11-11 ENCOUNTER — Other Ambulatory Visit: Payer: Self-pay

## 2023-11-11 DIAGNOSIS — Z5321 Procedure and treatment not carried out due to patient leaving prior to being seen by health care provider: Secondary | ICD-10-CM | POA: Diagnosis not present

## 2023-11-11 DIAGNOSIS — G43909 Migraine, unspecified, not intractable, without status migrainosus: Secondary | ICD-10-CM | POA: Diagnosis not present

## 2023-11-11 HISTORY — DX: Spinal stenosis, cervical region: M48.02

## 2023-11-11 LAB — CBC WITH DIFFERENTIAL/PLATELET
Abs Immature Granulocytes: 0.01 10*3/uL (ref 0.00–0.07)
Basophils Absolute: 0 10*3/uL (ref 0.0–0.1)
Basophils Relative: 1 %
Eosinophils Absolute: 0.4 10*3/uL (ref 0.0–0.5)
Eosinophils Relative: 6 %
HCT: 37 % (ref 36.0–46.0)
Hemoglobin: 13.2 g/dL (ref 12.0–15.0)
Immature Granulocytes: 0 %
Lymphocytes Relative: 19 %
Lymphs Abs: 1.2 10*3/uL (ref 0.7–4.0)
MCH: 33.7 pg (ref 26.0–34.0)
MCHC: 35.7 g/dL (ref 30.0–36.0)
MCV: 94.4 fL (ref 80.0–100.0)
Monocytes Absolute: 0.4 10*3/uL (ref 0.1–1.0)
Monocytes Relative: 6 %
Neutro Abs: 4.3 10*3/uL (ref 1.7–7.7)
Neutrophils Relative %: 68 %
Platelets: 172 10*3/uL (ref 150–400)
RBC: 3.92 MIL/uL (ref 3.87–5.11)
RDW: 11.5 % (ref 11.5–15.5)
WBC: 6.3 10*3/uL (ref 4.0–10.5)
nRBC: 0 % (ref 0.0–0.2)

## 2023-11-11 LAB — BASIC METABOLIC PANEL
Anion gap: 6 (ref 5–15)
BUN: 16 mg/dL (ref 6–20)
CO2: 29 mmol/L (ref 22–32)
Calcium: 8.8 mg/dL — ABNORMAL LOW (ref 8.9–10.3)
Chloride: 101 mmol/L (ref 98–111)
Creatinine, Ser: 0.7 mg/dL (ref 0.44–1.00)
GFR, Estimated: >60 mL/min (ref >60)
Glucose, Bld: 143 mg/dL — ABNORMAL HIGH (ref 70–99)
Potassium: 4.4 mmol/L (ref 3.5–5.1)
Sodium: 136 mmol/L (ref 135–145)

## 2023-11-11 NOTE — Telephone Encounter (Signed)
 Patient called after-hours call service with a complaint of migraine.  I was able to connect with the patient on the phone.  She has not seen anybody in our clinic since summer of last year, has not received Botox  injections regularly any longer.  She reports that she took Ubrelvy  twice and also Phenergan .  She was asking for Phenergan  suppository prescription but was encouraged to actually go to urgent care or emergency room to get more lasting relief as she already took Phenergan  by mouth.  She was advised to have someone take her.  She indicated that she may be able to go to [facility] or [facility].  She is encouraged to do so.  She reported that her husband could come home and take her.  She is encouraged to call our office to make a follow-up appointment with Harlene or any and consider restarting Botox  injections as they are typically very effective for migraine headaches.  No appointment is currently pending.  She reported that she started seeing a neurosurgeon for spinal stenosis.  She is advised to call back during normal clinic hours to request an appointment.  She demonstrated understanding and agreement.

## 2023-11-11 NOTE — ED Triage Notes (Signed)
 Severe Migraine; Sx began 1/8; Pt had another migraine on 1/4 and used Excedrin (dr. told her to only use one a week), Sx have been getting worse all day; pt took ubrelvy  at 2pm and 4pm and excedrin at 3pm as well as phenergen, no relief, Migraine is centered on R skul   [facility] for eval

## 2023-11-12 NOTE — Telephone Encounter (Signed)
 Pt returned call and was informed of the information in providers note. Pt stated that she will be reaching out to her PCP so that she can be referred to a Headache Clinic. Pt verbalized appreciation.

## 2023-11-12 NOTE — Telephone Encounter (Addendum)
 Patient was previously seen in July for scheduled Botox  but decided not to proceed with repeat botox  due to lack of benefit and was started on Qulipta . Per MyChart message in October, she stopped taking Qulipta  as she felt this caused migraines to be more frequent. Unfortunately, patient has failed multiple treatment options previously so not sure if we have much else to offer at this point. She is a prior patient of Dr. Rush. Patient does have a complicated history, request she is seen by an MD to establish care and review possible other options. Could also consider evaluation of headache clinic (referral can be obtained by PCP). Thank you.

## 2023-11-12 NOTE — Telephone Encounter (Signed)
 Called and LVM for pt to call the office to discuss providers note. Left office number and my name for the pt on VM.

## 2023-11-12 NOTE — Telephone Encounter (Signed)
 Noted! Thank you

## 2023-11-12 NOTE — Telephone Encounter (Signed)
 Noted and agree.

## 2023-11-13 ENCOUNTER — Encounter: Payer: Self-pay | Admitting: Endocrinology

## 2023-11-13 ENCOUNTER — Ambulatory Visit (INDEPENDENT_AMBULATORY_CARE_PROVIDER_SITE_OTHER): Payer: BC Managed Care – PPO | Admitting: Endocrinology

## 2023-11-13 VITALS — BP 110/60 | HR 71 | Resp 20 | Ht 63.0 in | Wt 122.6 lb

## 2023-11-13 DIAGNOSIS — E890 Postprocedural hypothyroidism: Secondary | ICD-10-CM | POA: Diagnosis not present

## 2023-11-13 NOTE — Telephone Encounter (Addendum)
 Pt reports that she has spoken with Dr Cecil office and is being told a referral is needed. Pt was reminded of what she was told on yesterday: to reach out to her PCP so that she can be referred to a [facility] , pt states that she will and call ended.

## 2023-11-13 NOTE — Progress Notes (Addendum)
 Outpatient Endocrinology Note Mesa Janus, MD   Patient's Name: [name]    DOB: 1966/03/07    MRN: [id number]  REASON OF VISIT: Follow-up for postablative hypothyroidism  PCP: Teresa Channel, MD  HISTORY OF PRESENT ILLNESS:   [name] is a 58 y.o. old female with past medical history as listed below is presented for a follow up  for postablative hypothyroidism.   Pertinent Thyroid  History: Patient was diagnosed with hypothyroidism at the age of 83, after she had radioactive iodine therapy for hyperthyroidism.  She was initially treated with levothyroxine  and changed to Armour Thyroid  in 2017, from outside provider, due to continued fatigue on levothyroxine , symptoms better on Armour Thyroid .  Currently taking NP thyroid , previously on Armour Thyroid  90 mg, 1 tablet on 6 days and half tablet on Sundays.   Interval history Patient has been taking NP thyroid  90 mg 1 tablet for 6 days and half tablet on Sundays.  She used to have palpitation and sleep disturbance however improvement on the symptoms after being treated with Lexapro.  She has complaints of occasional hot flashes, night sweat.  She also complains of  lethargy on Sundays when taking half tablet of NP thyroid .  No more palpitation or heat intolerance.  No change in bowel habit.  Body weight has been relatively stable.  No other complaints today.  REVIEW OF SYSTEMS:  As per history of present illness.   PAST MEDICAL HISTORY: Past Medical History:  Diagnosis Date   ASCUS of cervix with negative high risk HPV 06/2018   Breast cancer (HCC) 2010   Cervical spinal stenosis    Hypothyroidism    Migraine    Personal history of radiation therapy    2011 left breast   Urgency of urination     PAST SURGICAL HISTORY: Past Surgical History:  Procedure Laterality Date   AUGMENTATION MAMMAPLASTY Bilateral    BREAST BIOPSY Right 06/10/2023   BREAST BIOPSY Right 06/10/2023   US  RT BREAST BX W LOC DEV 1ST LESION IMG BX  SPEC US  GUIDE 06/10/2023 [facility]AMMOGRAP[facility]   BREAST LUMPECTOMY Left 09/25/2009   BREAST SURGERY  2010   left   BREAST SURGERY  04/2015   CAPSULECTOMY Left 07/20/2014   Procedure: LEFT BREAST CAPSULECTOMY/REPLACE IMPLANT FOR RECONSTRUCTION OF LEFT BREAST;  Surgeon: Alm Sick, MD;  Location: [facility] OR;  Service: Plastics;  Laterality: Left;   DIAGNOSTIC LAPAROSCOPY  1996   Endometriosis    ALLERGIES: Allergies  Allergen Reactions   Bupropion     Other Reaction(s): Dizziness   Topiramate Other (See Comments)    Headache   Galcanezumab-Gnlm Other (See Comments)    Rash, joint pain, sadness   Qulipta  [Atogepant ] Other (See Comments)    Worsening migraine     FAMILY HISTORY:  Family History  Problem Relation Age of Onset   Diabetes Mother    Hyperlipidemia Mother    Colon polyps Mother    Sleep apnea Mother    Hypertension Mother    Hyperlipidemia Father    Hypertension Father    Heart disease Father    Heart failure Father    Other Father        fructose intolerant   Sleep apnea Father    Cardiomyopathy Father    Hypertension Brother    Hypertension Brother    CVA Maternal Aunt    Colon cancer Maternal Uncle    Diabetes Maternal Grandmother    Cancer - Other Maternal Grandmother    CVA  Maternal Grandmother    Meniere's disease Maternal Grandmother    Hypertension Maternal Grandmother    Colon cancer Maternal Grandmother    Heart attack Maternal Grandfather    Hypertension Maternal Grandfather    Hypertension Paternal Grandmother    Heart attack Paternal Grandfather    Healthy Son    Healthy Son     SOCIAL HISTORY: Social History   Socioeconomic History   Marital status: Married    Spouse name: Not on file   Number of children: Not on file   Years of education: Not on file   Highest education level: Not on file  Occupational History   Not on file  Tobacco Use   Smoking status: Never    Passive exposure: Past   Smokeless tobacco: Never  Vaping Use    Vaping status: Never Used  Substance and Sexual Activity   Alcohol use: No   Drug use: No   Sexual activity: Yes    Partners: Male    Birth control/protection: Post-menopausal    Comment: 1st intercourse 76 yo-1 partner, des  Other Topics Concern   Not on file  Social History Narrative   Not on file   Social Drivers of Health   Financial Resource Strain: Not on file  Food Insecurity: No Food Insecurity (01/28/2022)   Received from [facility], [facility]   Hunger Vital Sign    Worried About Running Out of Food in the Last Year: Never true    Ran Out of Food in the Last Year: Never true  Transportation Needs: Not on file  Physical Activity: Not on file  Stress: Not on file  Social Connections: Unknown (03/18/2022)   Received from [facility], [facility]   Social Network    Social Network: Not on file    MEDICATIONS:  Current Outpatient Medications  Medication Sig Dispense Refill   acetaminophen (TYLENOL) 650 MG CR tablet Take 650 mg by mouth every 8 (eight) hours as needed for pain.     Ascorbic Acid (VITAMIN C PO) Take 1,000 mg by mouth in the morning and at bedtime.     Atogepant  (QULIPTA ) 60 MG TABS Take 1 tablet (60 mg total) by mouth daily. 30 tablet 11   CALCIUM PO Take 600 mg by mouth daily.     Cholecalciferol 100 MCG (4000 UT) CAPS Take 4,000 Units by mouth daily.     cyclobenzaprine (FLEXERIL) 5 MG tablet Take 5 mg by mouth as needed for muscle spasms.     EPINEPHrine 0.3 mg/0.3 mL IJ SOAJ injection      escitalopram (LEXAPRO) 10 MG tablet Take 10 mg by mouth daily.     estradiol  (ESTRACE ) 0.1 MG/GM vaginal cream PLACE 1 APPLICATORFUL VAGINALLY AT BEDTIME. RUB 1 ML INTO VAGINA 3 TIMES PER WEEK FOR SHORT COURSE. 43 g 0   fexofenadine (ALLEGRA ALLERGY) 180 MG tablet Take 180 mg by mouth daily.     gabapentin  (NEURONTIN ) 300 MG capsule Take 1 capsule (300 mg total) by mouth at bedtime. 30 capsule 6   MAGNESIUM PO Take by mouth daily.     meclizine  (ANTIVERT )  12.5 MG tablet Take 1 tablet (12.5 mg total) by mouth 3 (three) times daily as needed for dizziness. 30 tablet 0   Menatetrenone (VITAMIN K2) 100 MCG TABS Take 100 mcg by mouth daily.     Omega-3 Fatty Acids (FISH OIL) 1000 MG CAPS Take by mouth daily.     omeprazole (PRILOSEC) 20 MG capsule Take 20 mg  by mouth 2 (two) times daily before a meal.     Riboflavin (VITAMIN B2 PO) Take by mouth.     Theanine 100 MG CAPS Take by mouth.     traZODone (DESYREL) 50 MG tablet Take 100 mg by mouth at bedtime.     Ubrogepant  (UBRELVY ) 100 MG TABS Take 1 tablet (100 mg total) by mouth as needed. 16 tablet 1   thyroid  (NP THYROID ) 90 MG tablet Take 1 tablet (90 mg total) by mouth daily. 90 tablet 3   No current facility-administered medications for this visit.    PHYSICAL EXAM: Vitals:   11/13/23 0811  BP: 110/60  Pulse: 71  Resp: 20  SpO2: 98%  Weight: 122 lb 9.6 oz (55.6 kg)  Height: 5' 3 (1.6 m)   Body mass index is 21.72 kg/m.  Wt Readings from Last 3 Encounters:  11/13/23 122 lb 9.6 oz (55.6 kg)  06/11/23 121 lb (54.9 kg)  03/03/23 121 lb (54.9 kg)    General: Well developed, well nourished female in no apparent distress.  HEENT: AT/[place], no external lesions. Hearing intact to the spoken word Eyes: EOMI. No stare, proptosis or lid lag. Conjunctiva clear and no icterus. Neck: Trachea midline, neck supple  Lungs: Clear to auscultation, no wheeze. Respirations not labored Heart: S1S2, Regular in rate and rhythm.  Abdomen: Soft, non tender, non distended Neurologic: Alert, oriented, normal speech, deep tendon biceps reflexes normal,  no gross focal neurological deficit Extremities: No pedal pitting edema, no tremors of outstretched hands Skin: Warm, color good.  Psychiatric: Does not appear depressed or anxious  PERTINENT HISTORIC LABORATORY AND IMAGING STUDIES:  All pertinent laboratory results were reviewed. Please see HPI also for further details.   TSH  Date Value Ref Range Status   11/13/2023 7.03 (H) 0.40 - 4.50 mIU/L Final  02/27/2023 0.95 0.35 - 5.50 uIU/mL Final  08/21/2022 0.40 0.35 - 5.50 uIU/mL Final     ASSESSMENT / PLAN  1. Postablative hypothyroidism    -Patient has postablative hypothyroidism since the age of 35.  She has been on NP thyroid /Armour Thyroid  since 2017. -Currently taking NP thyroid  90 mg daily for 6 days and half tablet on Sundays. -Discussed options of thyroid  hormone replacement including levothyroxine  alone versus combination therapy with levothyroxine  and Cytomel /liothyronine  versus desiccated thyroid /NP/Armour Thyroid .  Discussed that due to increased amount of T3 on NP thyroid , will have increased risks of arrhythmia and bone loss.  Patient will consider changing to combination therapy with levothyroxine  and Cytomel  in the future.  Plan: -Patient will stay on NP thyroid , will adjust the dose based on thyroid  function test results today.  Patient complains of increased lethargy on Sundays when taking half tablet of NP thyroid . -Check TSH, free T4, free T3 today   Diagnoses and all orders for this visit:  Postablative hypothyroidism -     T3, free -     T4, free -     TSH -     thyroid  (NP THYROID ) 90 MG tablet; Take 1 tablet (90 mg total) by mouth daily.    DISPOSITION Follow up in clinic in 6 months suggested.  All questions answered and patient verbalized understanding of the plan.  Donalee Gaumond, MD [facility] Endocrinology [facility] [address] [place], [address] [place], [place] [address] Phone # [phone]  At least part of this note was generated using voice recognition software. Inadvertent word errors may have occurred, which were not recognized during the proofreading process.  Addendum: Labs reviewed elevated TSH with low free T4 and normal free T3, not adequate thyroid  hormone replacement.  I would like to increase NP thyroid  90 mg daily.  Check TSH, free T4, free T3 in 6 weeks.   Latest Reference  Range & Units 11/13/23 08:42  TSH 0.40 - 4.50 mIU/L 7.03 (H)  Triiodothyronine,Free,Serum 2.3 - 4.2 pg/mL 3.8  T4,Free(Direct) 0.8 - 1.8 ng/dL 0.7 (L)  (H): Data is abnormally high (L): Data is abnormally low

## 2023-11-14 LAB — T3, FREE: T3, Free: 3.8 pg/mL (ref 2.3–4.2)

## 2023-11-14 LAB — TSH: TSH: 7.03 m[IU]/L — ABNORMAL HIGH (ref 0.40–4.50)

## 2023-11-14 LAB — T4, FREE: Free T4: 0.7 ng/dL — ABNORMAL LOW (ref 0.8–1.8)

## 2023-11-16 MED ORDER — THYROID 90 MG PO TABS
90.0000 mg | ORAL_TABLET | Freq: Every day | ORAL | 3 refills | Status: DC
Start: 2023-11-16 — End: 2023-11-27

## 2023-11-16 NOTE — Addendum Note (Signed)
 Addended by: Zacharie Portner, [place] on: 11/16/2023 08:39 AM   Modules accepted: Orders

## 2023-11-17 ENCOUNTER — Encounter: Payer: Self-pay | Admitting: Physical Therapy

## 2023-11-17 ENCOUNTER — Ambulatory Visit: Payer: BC Managed Care – PPO | Admitting: Physical Therapy

## 2023-11-17 DIAGNOSIS — M5412 Radiculopathy, cervical region: Secondary | ICD-10-CM | POA: Diagnosis not present

## 2023-11-17 DIAGNOSIS — M62838 Other muscle spasm: Secondary | ICD-10-CM

## 2023-11-17 DIAGNOSIS — R278 Other lack of coordination: Secondary | ICD-10-CM

## 2023-11-17 NOTE — Therapy (Signed)
 OUTPATIENT PHYSICAL THERAPY FEMALE PELVIC TREATMENT Patient Name: [name] MRN: [id number] DOB:08/26/1966, 58 y.o., female Today's Date: 11/17/2023  END OF SESSION:  PT End of Session - 11/17/23 0811     Visit Number 4    Authorization Type bcbs 2024  DED/OOP MET  no auth req    PT Start Time 0805    PT Stop Time 0845    PT Time Calculation (min) 40 min    Activity Tolerance Patient tolerated treatment well    Behavior During Therapy [facility] for tasks assessed/performed                Past Medical History:  Diagnosis Date   ASCUS of cervix with negative high risk HPV 06/2018   Breast cancer (HCC) 2010   Cervical spinal stenosis    Hypothyroidism    Migraine    Personal history of radiation therapy    2011 left breast   Urgency of urination    Past Surgical History:  Procedure Laterality Date   AUGMENTATION MAMMAPLASTY Bilateral    BREAST BIOPSY Right 06/10/2023   BREAST BIOPSY Right 06/10/2023   US  RT BREAST BX W LOC DEV 1ST LESION IMG BX SPEC US  GUIDE 06/10/2023 [facility]AMMOGRAPHY   BREAST LUMPECTOMY Left 09/25/2009   BREAST SURGERY  2010   left   BREAST SURGERY  04/2015   CAPSULECTOMY Left 07/20/2014   Procedure: LEFT BREAST CAPSULECTOMY/REPLACE IMPLANT FOR RECONSTRUCTION OF LEFT BREAST;  Surgeon: Alm Sick, MD;  Location: [facility] OR;  Service: Plastics;  Laterality: Left;   DIAGNOSTIC LAPAROSCOPY  1996   Endometriosis   Patient Active Problem List   Diagnosis Date Noted   Closed fracture of right distal radius 05/28/2020   Sleep apnea 12/19/2019   Migraine without aura and without status migrainosus, not intractable 12/09/2018   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   Deviated septum 01/05/2018   Nasal deformity 01/05/2018   Nasal obstruction 01/05/2018   Headache around the eyes 10/06/2017   Chronic rhinitis 10/06/2017   Seasonal allergic rhinitis 10/06/2017   Breast cancer of upper-inner quadrant of left female breast (HCC) 12/02/2013    PCP: Teresa Channel, MD PCP - General  REFERRING PROVIDER: Glennon Almarie POUR, MD   REFERRING DIAG: N94.10 (ICD-10-CM) - Dyspareunia, female  THERAPY DIAG:  Other muscle spasm  Other lack of coordination  Rationale for Evaluation and Treatment: Rehabilitation  ONSET DATE: 1986  SUBJECTIVE:  SUBJECTIVE STATEMENT: Pt is doing well, was able to do massage 2-3 times last week There is one spot that is bothering her.  Wondering if she could use a lubricant with lidocaine     Fluid intake: Yes:      PAIN:  Are you having pain? Yes NPRS scale: 5/10 Pain location: Internal, External, Deep, Right, and Vaginal  Pain type: burning, sharp, and tight intense Pain description: intermittent and burning   Aggravating factors: dilators Relieving factors: no dilators  PRECAUTIONS: None  RED FLAGS: None   WEIGHT BEARING RESTRICTIONS: No  FALLS:  Has patient fallen in last 6 months? 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  PLOF: Independent  PATIENT GOALS: to be abl eto have intercourse again without pain  PERTINENT HISTORY:  Breast cancer, endometriosis Sexual abuse: No  BOWEL MOVEMENT: Has had constipations since she was a young child  Pain with bowel movement: Yes Type of bowel movement:Type (Bristol Stool Scale) 1 Fully empty rectum: Yes:   Leakage: no Pads: No Fiber supplement: No  URINATION: Pain with urination: No Fully empty bladder: Yes: does not know Stream:  does not know Urgency: Yes:   Frequency: yes-when she has a migraine- she will go every 10- 15 mins, longest she goes is 1.5 hours Leakage: Urge to void, Walking to  the bathroom, Coughing, Sneezing, Laughing, Exercise, Lifting, Bending forward, and Intercourse Pads: yes- 1/day thicker poise pads  INTERCOURSE: Pain with intercourse: Initial Penetration, During Penetration, After Intercourse, and Pain Interrupts Intercourse- used to Ability to have vaginal penetration:  No Climax: no Marinoff Scale: 3/3  PREGNANCY: Vaginal deliveries 2 Tearing Yes:   C-section deliveries 0 Currently pregnant No  PROLAPSE: None   OBJECTIVE:  Note: Objective measures were completed at Evaluation unless otherwise noted.    COGNITION: Overall cognitive status: Within functional limits for tasks assessed     SENSATION: Light touch: Appears intact Proprioception: Appears intact   POSTURE: No Significant postural limitations  PELVIC ALIGNMENT: within functional limitations   LUMBARAROM/PROM:  A/PROM A/PROM  eval  Flexion Palms on floor  Extension   Right lateral flexion   Left lateral flexion   Right rotation   Left rotation    (Blank rows = not tested)  LOWER EXTREMITY ROM: grossly within functional limitations   LOWER EXTREMITY MMT: grossly within functional limitations, some hypermobility present throughout   PALPATION:   General  tight and tender layer one vaginally and rectally                 External Perineal Exam within functional limitations                              Internal Pelvic Floor tenderness to touch throughout,  difficulty with bulging,  stool in rectum,  tight and tender external and internal anal sphincters  Short vaginal canal   Patient confirms identification and approves PT to assess internal pelvic floor and treatment Yes  PELVIC MMT:   MMT eval  Vaginal 3/5  Internal Anal Sphincter 4/5  External Anal Sphincter   Puborectalis   Diastasis Recti none  (Blank rows = not tested)        TONE: high  PROLAPSE: no  TODAY'S TREATMENT:  DATE: 11/17/23   Manual- superficial layer pelvic floor release performed as well as trial of dry needling layer 1 pelvic floor 7 o' clock , bilateral knee fallout, diaphragmatic breathing.  PATIENT EDUCATION:  Education details: dilators, lubricant, perineal massage, ohnuts, relevant anatomy Person educated: Patient Education method: Explanation, Demonstration, Tactile cues, Verbal cues, and Handouts Education comprehension: verbalized understanding and needs further education  HOME EXERCISE PROGRAM:[id number]  Treatment instructions: First Select Initial or Subsequent and then (Multi-select), will start with all checked Initial Treatment: Pt instructed on dry needling rationale, procedures, and possible side effects. Pt instructed to expect mild to moderate muscle soreness later today and/or tomorrow. Pt instructed in methods to reduce muscle soreness. Pt instructed to continue prescribed HEP. Because dry needling was performed over or adjacent to a lung field, pt was educated on S/S of pneumothorax and to seek immediate medical attention should they occur.  Patient was educated on signs and symptoms of infection and other risk factors and advised to seek medical attention should they occur. Patient verbalized understanding of these instructions and education. Subsequent Treatment Instructions provided previously at initial dry needling treatment Instructions reviewed, if requested by the patient, prior to subsequent dry needling treatment  Patient Verbal Consent Given: Yes Education handout provided: Yes  Muscles treated: right bulbocavernosus Electrical stimulation performed: No  Parameters: N/A Treatment response/outcome: less pain and spasm, improved lengthening of muscles    ASSESSMENT:  CLINICAL IMPRESSION: Tx focus- trial of dry needling pelvic floor layer one and manual release. Pt did well. Discussed continuing with  dilators per protocol as able. Reverse kegel still difficult however does not contract. Pt will cont to benefit from PT.      OBJECTIVE IMPAIRMENTS: decreased coordination, decreased knowledge of condition, decreased ROM, increased fascial restrictions, increased muscle spasms, impaired tone, and pain.   ACTIVITY LIMITATIONS: continence  PARTICIPATION LIMITATIONS: interpersonal relationship  PERSONAL FACTORS: Time since onset of injury/illness/exacerbation are also affecting patient's functional outcome.   REHAB POTENTIAL: Good  CLINICAL DECISION MAKING: Evolving/moderate complexity  EVALUATION COMPLEXITY: Moderate   GOALS: Goals reviewed with patient? Yes  SHORT TERM GOALS: Target date: 09/16/2024- updated 11/09/2022 -12/08/2023      Pt will be independent with HEP.   Baseline: Goal status: INITIAL  2.  Pt will be I with dilators Baseline:  Goal status: INITIAL  3.  Pt will be I with abdominal massage and use squatty potty to empty bowels better. Baseline:  Goal status: INITIAL   LONG TERM GOALS: Target date: 11/12/2023- updated 11/10/2023-02/02/2024      Pt will be independent with advanced HEP.   Baseline:  Goal status: INITIAL  2.  Pt will report max 2/10 pain with intercourse Baseline:  Goal status: INITIAL  3.  Pt will soak 0 pads/ day Baseline:  Goal status: INITIAL  PLAN:  PT FREQUENCY: 1-2x/week  PT DURATION: 12 weeks  PLANNED INTERVENTIONS: 97110-Therapeutic exercises, 97530- Therapeutic activity, 97112- Neuromuscular re-education, 97535- Self Care, [id number]- Manual therapy, Dry Needling, Joint mobilization, Spinal manipulation, Manual lymph drainage, and Scar mobilization  PLAN FOR NEXT SESSION: downtraining, diaphragmatic breathing    Ellenora Talton, PT 11/17/23 8:14 AM

## 2023-11-18 ENCOUNTER — Telehealth: Payer: Self-pay | Admitting: Adult Health

## 2023-11-18 NOTE — Telephone Encounter (Signed)
 Pt said worsening migraine, started at the end of December. Having migraines every other day.  Taking Urbrevly as directed; take up to 2 in 4 hours. /Would like to discuss restarting Botox  at office visit.   Have scheduled appointment with Dr. Omar Bibber on 02/15/24 at 05/16/24 at 7:45 am. Pt was a Dr. Billy Bue  patient

## 2023-11-18 NOTE — Telephone Encounter (Signed)
 I called pt,  I relayed that since she has tried multiple medications and had skins reactions, to side effects on botox , qulipta , CGRP', Dr Omar Bibber did not have anything more to offer.  Since pt relayed that [facility], recommended to keep that and pcp may even offer academic center if that is needed at a later time  pt verbalized understanding.  Ubrelvy  was not working for her as well as once did.

## 2023-11-18 NOTE — Telephone Encounter (Signed)
 Please see recent phone conversation. I do not have anything else to offer and she reported side effects with botox . She was advised to talk to PCP about a referral to a comprehensive [facility] center and I still recommend this. Unfortunately, I have nothing else to offer. She was in the process of getting schedule at the [facility], which is still an option or PCP can refer to an academic center, please call pt back.

## 2023-11-19 ENCOUNTER — Ambulatory Visit: Payer: BC Managed Care – PPO | Admitting: Family Medicine

## 2023-11-23 ENCOUNTER — Ambulatory Visit: Payer: BC Managed Care – PPO | Admitting: Physical Therapy

## 2023-11-23 ENCOUNTER — Encounter: Payer: Self-pay | Admitting: Endocrinology

## 2023-11-23 DIAGNOSIS — E890 Postprocedural hypothyroidism: Secondary | ICD-10-CM

## 2023-11-24 ENCOUNTER — Encounter: Payer: BC Managed Care – PPO | Admitting: Physical Therapy

## 2023-11-24 DIAGNOSIS — M5412 Radiculopathy, cervical region: Secondary | ICD-10-CM | POA: Diagnosis not present

## 2023-11-26 DIAGNOSIS — M5412 Radiculopathy, cervical region: Secondary | ICD-10-CM | POA: Diagnosis not present

## 2023-11-27 ENCOUNTER — Other Ambulatory Visit: Payer: Self-pay | Admitting: Endocrinology

## 2023-11-27 ENCOUNTER — Ambulatory Visit: Payer: BC Managed Care – PPO | Admitting: Physical Therapy

## 2023-11-27 ENCOUNTER — Telehealth: Payer: Self-pay

## 2023-11-27 MED ORDER — LIOTHYRONINE SODIUM 5 MCG PO TABS: 5.0000 ug | ORAL_TABLET | Freq: Every day | ORAL | 3 refills | Status: DC

## 2023-11-27 MED ORDER — LEVOTHYROXINE SODIUM 88 MCG PO TABS
88.0000 ug | ORAL_TABLET | Freq: Every day | ORAL | 3 refills | Status: DC
Start: 2023-11-27 — End: 2024-07-05

## 2023-11-27 NOTE — Telephone Encounter (Signed)
Patient given medication changes as directed by MD. No further questions at this time.

## 2023-11-27 NOTE — Telephone Encounter (Signed)
I would like to restart levothyroxine 88 mcg daily and liothyronine/Cytomel 5 mcg daily.  After starting this regimen of thyroid medication stop NP thyroid.  Check thyroid function test TSH, free T4, free T3 in 2 months, few days prior to follow-up visit with me.  I have placed orders.  Please reschedule to follow-up with me in about 2 months.    Iraq Omer Monter, MD [facility] Endocrinology [facility] [address] [place], [address] [place], [place] [address] Phone # [phone]

## 2023-12-01 ENCOUNTER — Ambulatory Visit: Payer: Self-pay | Admitting: Physical Therapy

## 2023-12-01 ENCOUNTER — Encounter: Payer: Self-pay | Admitting: Physical Therapy

## 2023-12-01 DIAGNOSIS — M62838 Other muscle spasm: Secondary | ICD-10-CM

## 2023-12-01 DIAGNOSIS — R278 Other lack of coordination: Secondary | ICD-10-CM | POA: Diagnosis not present

## 2023-12-01 NOTE — Therapy (Signed)
OUTPATIENT PHYSICAL THERAPY FEMALE PELVIC TREATMENT Patient Name: [name] MRN: [id number] DOB:01-20-1966, 58 y.o., female Today's Date: 12/01/2023  END OF SESSION:  PT End of Session - 12/01/23 1525     Visit Number 5    Authorization Type bcbs [id number]  DED/OOP MET  no auth req    PT Start Time 1445    PT Stop Time 1530    PT Time Calculation (min) 45 min    Activity Tolerance Patient tolerated treatment well    Behavior During Therapy [name] for tasks assessed/performed                 Past Medical History:  Diagnosis Date   ASCUS of cervix with negative high risk HPV 06/2018   Breast cancer (HCC) 2010   Cervical spinal stenosis    Hypothyroidism    Migraine    Personal history of radiation therapy    2011 left breast   Urgency of urination    Past Surgical History:  Procedure Laterality Date   AUGMENTATION MAMMAPLASTY Bilateral    BREAST BIOPSY Right 06/10/2023   BREAST BIOPSY Right 06/10/2023   [place] RT BREAST BX W LOC DEV 1ST LESION IMG BX SPEC US GUIDE 06/10/2023 [facility]   BREAST LUMPECTOMY Left 09/25/2009   BREAST SURGERY  2010   left   BREAST SURGERY  04/2015   CAPSULECTOMY Left 07/20/2014   Procedure: LEFT BREAST CAPSULECTOMY/REPLACE IMPLANT FOR RECONSTRUCTION OF LEFT BREAST;  Surgeon: Etter Sjogren, MD;  Location: [facility] OR;  Service: Plastics;  Laterality: Left;   DIAGNOSTIC LAPAROSCOPY  1996   Endometriosis   Patient Active Problem List   Diagnosis Date Noted   Closed fracture of right distal radius 05/28/2020   Sleep apnea 12/19/2019   Migraine without aura and without status migrainosus, not intractable 12/09/2018   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   Deviated septum 01/05/2018   Nasal deformity 01/05/2018   Nasal obstruction 01/05/2018   Headache around the eyes 10/06/2017   Chronic rhinitis 10/06/2017   Seasonal allergic rhinitis 10/06/2017   Breast cancer of upper-inner quadrant of left female breast (HCC) 12/02/2013    PCP: Laurann Montana, MD PCP - General  REFERRING PROVIDER: Earley Favor, MD   REFERRING DIAG: N94.10 (ICD-10-CM) - Dyspareunia, female  THERAPY DIAG:  Other muscle spasm  Other lack of coordination  Rationale for Evaluation and Treatment: Rehabilitation  ONSET DATE: 1986  SUBJECTIVE:  SUBJECTIVE STATEMENT: Pt reports that she has had some tremors. Has been doing estradiol cream and lubricant with dilator.  Ready to go up a size.  Doing superficial muscles release.  Leaking is a lot better.  Has not had intercourse in 8 years Husband cannot give her orgasm It has become difficult to have orgasm by herself.  ( Chronic migraines contribute to decreased libido)  Fluid intake: Yes:      PAIN:  Are you having pain? Yes NPRS scale: 5/10 Pain location: Internal, External, Deep, Right, and Vaginal  Pain type: burning, sharp, and tight intense Pain description: intermittent and burning   Aggravating factors: dilators Relieving factors: no dilators  PRECAUTIONS: None  RED FLAGS: None   WEIGHT BEARING RESTRICTIONS: No  FALLS:  Has patient fallen in last 6 months? 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  PLOF: Independent  PATIENT GOALS: to be abl eto have intercourse again without pain  PERTINENT HISTORY:  Breast cancer, endometriosis Sexual abuse: No  BOWEL MOVEMENT: Has had constipations since she was a young child  Pain with bowel movement: Yes Type of bowel movement:Type (Bristol Stool Scale) 1 Fully empty rectum: Yes:   Leakage: no Pads: No Fiber supplement: No  URINATION: Pain with urination: No Fully empty  bladder: Yes: does not know Stream:  does not know Urgency: Yes:   Frequency: yes-when she has a migraine- she will go every 10- 15 mins, longest she goes is 1.5 hours Leakage: Urge to void, Walking to the bathroom, Coughing, Sneezing, Laughing, Exercise, Lifting, Bending forward, and Intercourse Pads: yes- 1/day thicker poise pads  INTERCOURSE: Pain with intercourse: Initial Penetration, During Penetration, After Intercourse, and Pain Interrupts Intercourse- used to Ability to have vaginal penetration:  No Climax: no Marinoff Scale: 3/3  PREGNANCY: Vaginal deliveries 2 Tearing Yes:   C-section deliveries 0 Currently pregnant No  PROLAPSE: None   OBJECTIVE:  Note: Objective measures were completed at Evaluation unless otherwise noted.    COGNITION: Overall cognitive status: Within functional limits for tasks assessed     SENSATION: Light touch: Appears intact Proprioception: Appears intact   POSTURE: No Significant postural limitations  PELVIC ALIGNMENT: within functional limitations   LUMBARAROM/PROM:  A/PROM A/PROM  eval  Flexion Palms on floor  Extension   Right lateral flexion   Left lateral flexion   Right rotation   Left rotation    (Blank rows = not tested)  LOWER EXTREMITY ROM: grossly within functional limitations   LOWER EXTREMITY MMT: grossly within functional limitations, some hypermobility present throughout   PALPATION:   General  tight and tender layer one vaginally and rectally                 External Perineal Exam within functional limitations                              Internal Pelvic Floor tenderness to touch throughout,  difficulty with bulging,  stool in rectum,  tight and tender external and internal anal sphincters  Short vaginal canal   Patient confirms identification and approves PT to assess internal pelvic floor and treatment Yes  PELVIC MMT:   MMT eval  Vaginal 3/5  Internal Anal Sphincter 4/5  External Anal  Sphincter   Puborectalis   Diastasis Recti none  (  Blank rows = not tested)        TONE: high  PROLAPSE: no  TODAY'S TREATMENT:                                                                                                                              DATE: 12/01/23    Neuro reed- added uptraining exercises with transverse abdominis breath for SUI   Therapeutic activities- discussed low libido, sex counseling,   PATIENT EDUCATION:  Education details: dilators, lubricant, perineal massage, ohnuts, relevant anatomy Person educated: Patient Education method: Explanation, Demonstration, Tactile cues, Verbal cues, and Handouts Education comprehension: verbalized understanding and needs further education  HOME EXERCISE PROGRAM:[id number]     ASSESSMENT:  CLINICAL IMPRESSION: Pt progressing well. Discussed potential benefits of sex counseling.  Tx focus- review of progress, dilator training. Pt will benefit from cont PT to improve SUI.  ( Chronic migraines contribute to decreased libido)   OBJECTIVE IMPAIRMENTS: decreased coordination, decreased knowledge of condition, decreased ROM, increased fascial restrictions, increased muscle spasms, impaired tone, and pain.   ACTIVITY LIMITATIONS: continence  PARTICIPATION LIMITATIONS: interpersonal relationship  PERSONAL FACTORS: Time since onset of injury/illness/exacerbation are also affecting patient's functional outcome.   REHAB POTENTIAL: Good  CLINICAL DECISION MAKING: Evolving/moderate complexity  EVALUATION COMPLEXITY: Moderate   GOALS: Goals reviewed with patient? Yes  SHORT TERM GOALS: Target date: 09/16/2024- updated 11/09/2022 -12/08/2023      Pt will be independent with HEP.   Baseline: Goal status: met  2.  Pt will be I with dilators Baseline:  Goal status: met  3.  Pt will be I with abdominal massage and use squatty potty to empty bowels better. Baseline:  Goal status: progressing   LONG TERM GOALS:  Target date: 11/12/2023- updated 11/10/2023-02/02/2024      Pt will be independent with advanced HEP.   Baseline:  Goal status: INITIAL  2.  Pt will report max 2/10 pain with intercourse Baseline:  Goal status: not having IC  3.  Pt will soak 0 pads/ day Baseline:  Goal status: progressing  PLAN:  PT FREQUENCY: 1-2x/week  PT DURATION: 12 weeks  PLANNED INTERVENTIONS: 97110-Therapeutic exercises, 97530- Therapeutic activity, 97112- Neuromuscular re-education, 97535- Self Care, 16109- Manual therapy, Dry Needling, Joint mobilization, Spinal manipulation, Manual lymph drainage, and Scar mobilization  PLAN FOR NEXT SESSION:  core strengthening   Jalexis Breed, PT 12/01/23 3:27 PM

## 2023-12-28 ENCOUNTER — Other Ambulatory Visit: Payer: BC Managed Care – PPO

## 2024-01-07 DIAGNOSIS — G43719 Chronic migraine without aura, intractable, without status migrainosus: Secondary | ICD-10-CM | POA: Diagnosis not present

## 2024-01-07 DIAGNOSIS — Z79899 Other long term (current) drug therapy: Secondary | ICD-10-CM | POA: Diagnosis not present

## 2024-01-07 DIAGNOSIS — Z049 Encounter for examination and observation for unspecified reason: Secondary | ICD-10-CM | POA: Diagnosis not present

## 2024-01-08 ENCOUNTER — Encounter: Payer: Self-pay | Admitting: Physical Therapy

## 2024-01-08 ENCOUNTER — Ambulatory Visit: Payer: BC Managed Care – PPO | Attending: Obstetrics and Gynecology | Admitting: Physical Therapy

## 2024-01-08 DIAGNOSIS — R278 Other lack of coordination: Secondary | ICD-10-CM | POA: Diagnosis not present

## 2024-01-08 DIAGNOSIS — M62838 Other muscle spasm: Secondary | ICD-10-CM | POA: Diagnosis not present

## 2024-01-08 NOTE — Therapy (Addendum)
 OUTPATIENT PHYSICAL THERAPY FEMALE PELVIC TREATMENT reeval/ POC update Patient Name: [name] MRN: [id number] DOB:06/15/1966, 58 y.o., female Today's Date: 01/08/2024  END OF SESSION:  PT End of Session - 01/08/24 1034     Visit Number 6    Authorization Type bcbs [id number]  AUTH REQ   - waiting on auth    PT Start Time 1015    PT Stop Time 1110    PT Time Calculation (min) 55 min    Activity Tolerance Patient tolerated treatment well    Behavior During Therapy [facility] for tasks assessed/performed                  Past Medical History:  Diagnosis Date   ASCUS of cervix with negative high risk HPV 06/2018   Breast cancer (HCC) 2010   Cervical spinal stenosis    Hypothyroidism    Migraine    Personal history of radiation therapy    2011 left breast   Urgency of urination    Past Surgical History:  Procedure Laterality Date   AUGMENTATION MAMMAPLASTY Bilateral    BREAST BIOPSY Right 06/10/2023   BREAST BIOPSY Right 06/10/2023   [place] RT BREAST BX W LOC DEV 1ST LESION IMG BX SPEC US GUIDE 06/10/2023 [facility]   BREAST LUMPECTOMY Left 09/25/2009   BREAST SURGERY  2010   left   BREAST SURGERY  04/2015   CAPSULECTOMY Left 07/20/2014   Procedure: LEFT BREAST CAPSULECTOMY/REPLACE IMPLANT FOR RECONSTRUCTION OF LEFT BREAST;  Surgeon: Etter Sjogren, MD;  Location: [facility] OR;  Service: Plastics;  Laterality: Left;   DIAGNOSTIC LAPAROSCOPY  1996   Endometriosis   Patient Active Problem List   Diagnosis Date Noted   Closed fracture of right distal radius 05/28/2020   Sleep apnea 12/19/2019   Migraine without aura and without status migrainosus, not intractable 12/09/2018   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   Deviated septum 01/05/2018   Nasal deformity 01/05/2018   Nasal obstruction 01/05/2018   Headache around the eyes 10/06/2017   Chronic rhinitis 10/06/2017   Seasonal allergic rhinitis 10/06/2017   Breast cancer of upper-inner quadrant of left female breast (HCC)  12/02/2013    PCP: Laurann Montana, MD PCP - General  REFERRING PROVIDER: Earley Favor, MD   REFERRING DIAG: N94.10 (ICD-10-CM) - Dyspareunia, female  THERAPY DIAG:  Other muscle spasm  Other lack of coordination  Rationale for Evaluation and Treatment: Rehabilitation  ONSET DATE: 1986  SUBJECTIVE:  SUBJECTIVE STATEMENT: Pt reports that she has been progressing well. Using larger size dilators, is very excited. Has a vibrator, does not find it comfortable Leaking is a little improved Does her exercises consistently.  Wears one pad and just moves on.    Fluid intake: Yes:      PAIN:  Are you having pain? Yes NPRS scale: 5/10 Pain location: Internal, External, Deep, Right, and Vaginal  Pain type: burning, sharp, and tight intense Pain description: intermittent and burning   Aggravating factors: dilators Relieving factors: no dilators  PRECAUTIONS: None  RED FLAGS: None   WEIGHT BEARING RESTRICTIONS: No  FALLS:  Has patient fallen in last 6 months? 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  PLOF: Independent  PATIENT GOALS: to be abl eto have intercourse again without pain  PERTINENT HISTORY:  Breast cancer, endometriosis Sexual abuse: No  BOWEL MOVEMENT: Has had constipations since she was a young child  Pain with bowel movement: Yes Type of bowel movement:Type (Bristol Stool Scale) 1 Fully empty rectum: Yes:   Leakage: no Pads: No Fiber supplement: No  URINATION: Pain with urination: No Fully empty bladder: Yes: does not know Stream:  does not know Urgency: Yes:   Frequency: yes-when she has a  migraine- she will go every 10- 15 mins, longest she goes is 1.5 hours Leakage: Urge to void, Walking to the bathroom, Coughing, Sneezing, Laughing, Exercise, Lifting, Bending forward, and Intercourse Pads: yes- 1/day thicker poise pads  INTERCOURSE: Pain with intercourse: Initial Penetration, During Penetration, After Intercourse, and Pain Interrupts Intercourse- used to Ability to have vaginal penetration:  No Climax: no Marinoff Scale: 3/3  PREGNANCY: Vaginal deliveries 2 Tearing Yes:   C-section deliveries 0 Currently pregnant No  PROLAPSE: None   OBJECTIVE:  Note: Objective measures were completed at Evaluation unless otherwise noted.    COGNITION: Overall cognitive status: Within functional limits for tasks assessed     SENSATION: Light touch: Appears intact Proprioception: Appears intact   POSTURE: No Significant postural limitations  PELVIC ALIGNMENT: within functional limitations   LUMBARAROM/PROM:  A/PROM A/PROM  eval  Flexion Palms on floor  Extension   Right lateral flexion   Left lateral flexion   Right rotation   Left rotation    (Blank rows = not tested)  LOWER EXTREMITY ROM: grossly within functional limitations   LOWER EXTREMITY MMT: grossly within functional limitations, some hypermobility present throughout   PALPATION:   General  tight and tender layer one vaginally and rectally                 External Perineal Exam within functional limitations                              Internal Pelvic Floor tenderness to touch throughout,  difficulty with bulging,  stool in rectum,  tight and tender external and internal anal sphincters  Short vaginal canal   Patient confirms identification and approves PT to assess internal pelvic floor and treatment Yes  PELVIC MMT:   MMT eval  Vaginal 3/5  Internal Anal Sphincter 4/5  External Anal Sphincter   Puborectalis   Diastasis Recti none  (Blank rows = not tested)         TONE: high  PROLAPSE: no  TODAY'S TREATMENT:  DATE: 01/08/24    Neuro reed- happy baby Child's pose with diaphragmatic breathing There acts- review of progress, dilators, oh nuts, HEP     PATIENT EDUCATION:  Education details: dilators, lubricant, perineal massage, ohnuts, relevant anatomy Person educated: Patient Education method: Explanation, Demonstration, Tactile cues, Verbal cues, and Handouts Education comprehension: verbalized understanding and needs further education  HOME EXERCISE PROGRAM:[id number]     ASSESSMENT:  CLINICAL IMPRESSION: Pt has been progressing well, working with larger dilator. Less urinary incontinence.  Auth sent.     OBJECTIVE IMPAIRMENTS: decreased coordination, decreased knowledge of condition, decreased ROM, increased fascial restrictions, increased muscle spasms, impaired tone, and pain.   ACTIVITY LIMITATIONS: continence  PARTICIPATION LIMITATIONS: interpersonal relationship  PERSONAL FACTORS: Time since onset of injury/illness/exacerbation are also affecting patient's functional outcome.   REHAB POTENTIAL: Good  CLINICAL DECISION MAKING: Evolving/moderate complexity  EVALUATION COMPLEXITY: Moderate   GOALS: Goals reviewed with patient? Yes  SHORT TERM GOALS: Target date: 09/16/2024- updated 11/09/2022 -12/08/2023      Pt will be independent with HEP.   Baseline: Goal status: met  2.  Pt will be I with dilators Baseline:  Goal status: met  3.  Pt will be I with abdominal massage and use squatty potty to empty bowels better. Baseline:  Goal status: progressing   LONG TERM GOALS: Target date: 11/12/2023- updated 11/10/2023-02/02/2024 updated 07/04/2024      Pt will be independent with advanced HEP.   Baseline:  Goal status: progressing  2.  Pt will report max 2/10 pain with intercourse Baseline:   Goal status: not yet  3.  Pt will soak 0 pads/ day Baseline:  Goal status: progressing  PLAN:  PT FREQUENCY: 1-2x/week  PT DURATION: 6 months  PLANNED INTERVENTIONS: 97110-Therapeutic exercises, 97530- Therapeutic activity, 97112- Neuromuscular re-education, 97535- Self Care, 54098- Manual therapy, Dry Needling, Joint mobilization, Spinal manipulation, Manual lymph drainage, and Scar mobilization  PLAN FOR NEXT SESSION:  core strengthening   Alleen Kehm, PT 01/08/24 11:19 AM

## 2024-01-14 DIAGNOSIS — G5603 Carpal tunnel syndrome, bilateral upper limbs: Secondary | ICD-10-CM | POA: Diagnosis not present

## 2024-01-21 ENCOUNTER — Other Ambulatory Visit: Payer: Self-pay | Admitting: Obstetrics and Gynecology

## 2024-01-21 NOTE — Telephone Encounter (Signed)
 Med refill request: estradiol  0.1 mg vaginal cream Last AEX: 06/11/23 Next AEX: none scheduled Last MMG (if hormonal med) 05/26/23 BI-RADS 4 suspicious, 06/10/23 breast biopsy Refill authorized: estradiol 0.1 mg vaginal cream Please approve or deny as appropriate.

## 2024-01-22 ENCOUNTER — Other Ambulatory Visit: Payer: Self-pay | Admitting: Obstetrics and Gynecology

## 2024-01-22 DIAGNOSIS — N63 Unspecified lump in unspecified breast: Secondary | ICD-10-CM

## 2024-01-26 ENCOUNTER — Other Ambulatory Visit: Payer: Self-pay

## 2024-01-26 ENCOUNTER — Encounter: Payer: Self-pay | Admitting: Physical Therapy

## 2024-01-26 ENCOUNTER — Ambulatory Visit: Admitting: Physical Therapy

## 2024-01-26 DIAGNOSIS — R278 Other lack of coordination: Secondary | ICD-10-CM

## 2024-01-26 DIAGNOSIS — M62838 Other muscle spasm: Secondary | ICD-10-CM | POA: Diagnosis not present

## 2024-01-26 NOTE — Therapy (Addendum)
 OUTPATIENT PHYSICAL THERAPY FEMALE PELVIC TREATMENT reeval/ POC update/ later date discharge Patient Name: [name] MRN: [id number] DOB:13-Nov-1965, 58 y.o., female Today's Date: 01/26/2024  END OF SESSION:  PT End of Session - 01/26/24 1246     Visit Number 7    Authorization Type [employer] approved 4 visits 01/08/2024 - 02/06/2024 auth#[id number]  bcbs 2025    PT Start Time 1230    PT Stop Time 1315    PT Time Calculation (min) 45 min    Activity Tolerance Patient tolerated treatment well    Behavior During Therapy [facility] for tasks assessed/performed                   Past Medical History:  Diagnosis Date   ASCUS of cervix with negative high risk HPV 06/2018   Breast cancer (HCC) 2010   Cervical spinal stenosis    Hypothyroidism    Migraine    Personal history of radiation therapy    2011 left breast   Urgency of urination    Past Surgical History:  Procedure Laterality Date   AUGMENTATION MAMMAPLASTY Bilateral    BREAST BIOPSY Right 06/10/2023   BREAST BIOPSY Right 06/10/2023   US  RT BREAST BX W LOC DEV 1ST LESION IMG BX SPEC US  GUIDE 06/10/2023 [facility]AMMOGRAP[facility]   BREAST LUMPECTOMY Left 09/25/2009   BREAST SURGERY  2010   left   BREAST SURGERY  04/2015   CAPSULECTOMY Left 07/20/2014   Procedure: LEFT BREAST CAPSULECTOMY/REPLACE IMPLANT FOR RECONSTRUCTION OF LEFT BREAST;  Surgeon: Alm Sick, MD;  Location: [facility] OR;  Service: Plastics;  Laterality: Left;   DIAGNOSTIC LAPAROSCOPY  1996   Endometriosis   Patient Active Problem List   Diagnosis Date Noted   Closed fracture of right distal radius 05/28/2020   Sleep apnea 12/19/2019   Migraine without aura and without status migrainosus, not intractable 12/09/2018   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   Deviated septum 01/05/2018   Nasal deformity 01/05/2018   Nasal obstruction 01/05/2018   Headache around the eyes 10/06/2017   Chronic rhinitis 10/06/2017   Seasonal allergic rhinitis 10/06/2017   Breast cancer of  upper-inner quadrant of left female breast (HCC) 12/02/2013    PCP: Teresa Channel, MD PCP - General  REFERRING PROVIDER: Glennon Almarie POUR, MD   REFERRING DIAG: N94.10 (ICD-10-CM) - Dyspareunia, female  THERAPY DIAG:  Other muscle spasm  Other lack of coordination  Rationale for Evaluation and Treatment: Rehabilitation  ONSET DATE: 1986  SUBJECTIVE:  SUBJECTIVE STATEMENT: Pt reports that she worked herself up to the largest dilator and they were able to have penetration, It hurt where her urethra is.  Iterested in dry needling in there.  Using [employer] different positions.  She is very excited, husband excited.   Leaking has been a little worse    Fluid intake: Yes:     PAIN:  Are you having pain? Yes NPRS scale: 5/10 Pain location: Internal, External, Deep, Right, and Vaginal  Pain type: burning, sharp, and tight intense Pain description: intermittent and burning   Aggravating factors: dilators Relieving factors: no dilators  PRECAUTIONS: None  RED FLAGS: None   WEIGHT BEARING RESTRICTIONS: No  FALLS:  Has patient fallen in last 6 months? 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  PLOF: Independent  PATIENT GOALS: to be abl eto have intercourse again without pain  PERTINENT HISTORY:  Breast cancer, endometriosis Sexual abuse: No  BOWEL MOVEMENT: Has had constipations since she was a young child  Pain with bowel movement: Yes Type of bowel movement:Type (Bristol Stool Scale) 1 Fully empty rectum: Yes:   Leakage: no Pads: No Fiber supplement: No  URINATION: Pain with urination: No Fully empty  bladder: Yes: does not know Stream: does not know Urgency: Yes:   Frequency: yes-when she has a migraine- she will go every 10- 15 mins, longest she goes is 1.5 hours Leakage: Urge to void, Walking to the bathroom, Coughing, Sneezing, Laughing, Exercise, Lifting, Bending forward, and Intercourse Pads: yes- 1/day thicker poise pads  INTERCOURSE: Pain with intercourse: Initial Penetration, During Penetration, After Intercourse, and Pain Interrupts Intercourse- used to Ability to have vaginal penetration:  No Climax: no Marinoff Scale: 3/3  PREGNANCY: Vaginal deliveries 2 Tearing Yes:   C-section deliveries 0 Currently pregnant No  PROLAPSE: None   OBJECTIVE:  Note: Objective measures were completed at Evaluation unless otherwise noted.    COGNITION: Overall cognitive status: Within functional limits for tasks assessed     SENSATION: Light touch: Appears intact Proprioception: Appears intact   POSTURE: No Significant postural limitations  PELVIC ALIGNMENT: within functional limitations   LUMBARAROM/PROM:  A/PROM A/PROM  eval  Flexion Palms on floor  Extension   Right lateral flexion   Left lateral flexion   Right rotation   Left rotation    (Blank rows = not tested)  LOWER EXTREMITY ROM: grossly within functional limitations   LOWER EXTREMITY MMT: grossly within functional limitations, some hypermobility present throughout   PALPATION:   General  tight and tender layer one vaginally and rectally                 External Perineal Exam within functional limitations                              Internal Pelvic Floor tenderness to touch throughout,  difficulty with bulging,  stool in rectum,  tight and tender external and internal anal sphincters  Short vaginal canal   Patient confirms identification and approves PT to assess internal pelvic floor and treatment Yes  PELVIC MMT:   MMT eval  Vaginal 3/5  Internal Anal Sphincter 4/5  External Anal  Sphincter   Puborectalis   Diastasis Recti none  (Blank rows = not tested)  TONE: high  PROLAPSE: no  TODAY'S TREATMENT:                                                                                                                              DATE: 01/26/24     There acts- review of progress, dilators, oh nuts, HEP, relevant anatomy, lubricants Manual- pelvic floor release and dry needling and STM- abdominals, Right adductor and perineum    PATIENT EDUCATION:  Education details: dilators, lubricant, perineal massage, ohnuts, relevant anatomy Person educated: Patient Education method: Explanation, Demonstration, Tactile cues, Verbal cues, and Handouts Education comprehension: verbalized understanding and needs further education Trigger Point Dry Needling  Subsequent Treatment: Instructions provided previously at initial dry needling treatment.   Patient Verbal Consent Given: Yes Education Handout Provided: Yes Muscles Treated: rectus abdominis bilateral, right adductor, perineum Electrical Stimulation Performed: No Treatment Response/Outcome: less spasm and pain   HOME EXERCISE PROGRAM:[id number]     ASSESSMENT:  CLINICAL IMPRESSION: Pt has made great progress. She did well with dry needling today and manual and will cont to use dilators and try intercourse with silicone based lubricant with oh nuts with more correct use. She is able to have intercourse with her husband now, has appt with sex councilor scheduled.      OBJECTIVE IMPAIRMENTS: decreased coordination, decreased knowledge of condition, decreased ROM, increased fascial restrictions, increased muscle spasms, impaired tone, and pain.   ACTIVITY LIMITATIONS: continence  PARTICIPATION LIMITATIONS: interpersonal relationship  PERSONAL FACTORS: Time since onset of injury/illness/exacerbation are also affecting patient's functional outcome.   REHAB POTENTIAL: Good  CLINICAL DECISION MAKING:  Evolving/moderate complexity  EVALUATION COMPLEXITY: Moderate   GOALS: Goals reviewed with patient? Yes  SHORT TERM GOALS: Target date: 09/16/2024- updated 11/09/2022 -12/08/2023      Pt will be independent with HEP.   Baseline: Goal status: met  2.  Pt will be I with dilators Baseline:  Goal status: met  3.  Pt will be I with abdominal massage and use squatty potty to empty bowels better. Baseline:  Goal status: progressing   LONG TERM GOALS: Target date: 11/12/2023- updated 11/10/2023-02/02/2024 updated 07/04/2024      Pt will be independent with advanced HEP.   Baseline:  Goal status: progressing  2.  Pt will report max 2/10 pain with intercourse Baseline:  Goal status: progressing  3.  Pt will soak 0 pads/ day Baseline:  Goal status: progressing  PLAN:  PT FREQUENCY: 1-2x/week  PT DURATION: 6 months  PLANNED INTERVENTIONS: 97110-Therapeutic exercises, 97530- Therapeutic activity, 97112- Neuromuscular re-education, 97535- Self Care, 02859- Manual therapy, Dry Needling, Joint mobilization, Spinal manipulation, Manual lymph drainage, and Scar mobilization  PLAN FOR NEXT SESSION:  core strengthening   Evett Kassa, PT 01/26/24 1:21 PM    PHYSICAL THERAPY DISCHARGE SUMMARY  Visits from Start of Care: 7   Patient agrees to discharge. Patient goals were partially met. Patient is being discharged due to not returning since the last visit.  Cori Florida, PT, DPT  [facility] [address], [address] [place], [place] [address] Phone # [phone] Fax [phone]

## 2024-01-27 DIAGNOSIS — N3000 Acute cystitis without hematuria: Secondary | ICD-10-CM | POA: Diagnosis not present

## 2024-02-02 ENCOUNTER — Other Ambulatory Visit: Payer: BC Managed Care – PPO

## 2024-02-02 DIAGNOSIS — E890 Postprocedural hypothyroidism: Secondary | ICD-10-CM | POA: Diagnosis not present

## 2024-02-02 LAB — T3, FREE: T3, Free: 2.8 pg/mL (ref 2.3–4.2)

## 2024-02-02 LAB — TSH: TSH: 4.37 m[IU]/L (ref 0.40–4.50)

## 2024-02-02 LAB — T4, FREE: Free T4: 1 ng/dL (ref 0.8–1.8)

## 2024-02-03 ENCOUNTER — Encounter: Payer: Self-pay | Admitting: Endocrinology

## 2024-02-04 ENCOUNTER — Ambulatory Visit: Payer: Self-pay | Admitting: Physical Therapy

## 2024-02-05 ENCOUNTER — Ambulatory Visit (INDEPENDENT_AMBULATORY_CARE_PROVIDER_SITE_OTHER): Payer: BC Managed Care – PPO | Admitting: Endocrinology

## 2024-02-05 ENCOUNTER — Encounter: Payer: Self-pay | Admitting: Endocrinology

## 2024-02-05 VITALS — BP 108/60 | HR 59 | Resp 18 | Ht 63.0 in | Wt 120.2 lb

## 2024-02-05 DIAGNOSIS — E890 Postprocedural hypothyroidism: Secondary | ICD-10-CM

## 2024-02-05 NOTE — Progress Notes (Signed)
 Outpatient Endocrinology Note Jamie Narada Uzzle, MD   Patient's Name: [name]    DOB: 04/23/1966    MRN: [id number]  REASON OF VISIT: Follow-up for postablative hypothyroidism  PCP: Laurann Montana, MD  HISTORY OF PRESENT ILLNESS:   [name] is a 58 y.o. old female with past medical history as listed below is presented for a follow up  for postablative hypothyroidism.   Pertinent Thyroid History: Patient was diagnosed with hypothyroidism at the age of 64, after she had radioactive iodine therapy for hyperthyroidism.  She was initially treated with levothyroxine and changed to Armour Thyroid in 2017, from outside provider, due to continued fatigue on levothyroxine, symptoms better on Armour Thyroid.  -In January 2025 she was taking NP thyroid, previously on Armour Thyroid,  90 mg, 1 tablet on 6 days and half tablet on Sundays.  Switched to combination thyroid hormone replacement with levothyroxine 88 mcg and liothyronine 5 mcg daily in the end of January 2025.   Interval history Patient has been taking levothyroxine 88 mcg daily and liothyronine 5 Mg daily in the morning.  She complains of continued fatigue also complaining of feeling heat around the neck area.  She has history of breast cancer and following with OB/GYN.  No palpitation.  Recent laboratory results reviewed 2 normal TSH, normal free T4 and free T3.  TSH is in the high normal range.  No other complaints today.   Latest Reference Range & Units 02/02/24 10:15  TSH 0.40 - 4.50 mIU/L 4.37  Triiodothyronine,Free,Serum 2.3 - 4.2 pg/mL 2.8  T4,Free(Direct) 0.8 - 1.8 ng/dL 1.0    REVIEW OF SYSTEMS:  As per history of present illness.   PAST MEDICAL HISTORY: Past Medical History:  Diagnosis Date   ASCUS of cervix with negative high risk HPV 06/2018   Breast cancer (HCC) 2010   Cervical spinal stenosis    Hypothyroidism    Migraine    Personal history of radiation therapy    2011 left breast   Urgency of  urination     PAST SURGICAL HISTORY: Past Surgical History:  Procedure Laterality Date   AUGMENTATION MAMMAPLASTY Bilateral    BREAST BIOPSY Right 06/10/2023   BREAST BIOPSY Right 06/10/2023   [place] RT BREAST BX W LOC DEV 1ST LESION IMG BX SPEC US GUIDE 06/10/2023 [facility]   BREAST LUMPECTOMY Left 09/25/2009   BREAST SURGERY  2010   left   BREAST SURGERY  04/2015   CAPSULECTOMY Left 07/20/2014   Procedure: LEFT BREAST CAPSULECTOMY/REPLACE IMPLANT FOR RECONSTRUCTION OF LEFT BREAST;  Surgeon: Etter Sjogren, MD;  Location: [facility] OR;  Service: Plastics;  Laterality: Left;   DIAGNOSTIC LAPAROSCOPY  1996   Endometriosis    ALLERGIES: Allergies  Allergen Reactions   Bupropion     Other Reaction(s): Dizziness   Topiramate Other (See Comments)    Headache   Galcanezumab-Gnlm Other (See Comments)    Rash, joint pain, sadness   Qulipta [Atogepant] Other (See Comments)    Worsening migraine     FAMILY HISTORY:  Family History  Problem Relation Age of Onset   Diabetes Mother    Hyperlipidemia Mother    Colon polyps Mother    Sleep apnea Mother    Hypertension Mother    Hyperlipidemia Father    Hypertension Father    Heart disease Father    Heart failure Father    Other Father        fructose intolerant   Sleep apnea Father  Cardiomyopathy Father    Hypertension Brother    Hypertension Brother    CVA Maternal Aunt    Colon cancer Maternal Uncle    Diabetes Maternal Grandmother    Cancer - Other Maternal Grandmother    CVA Maternal Grandmother    Meniere's disease Maternal Grandmother    Hypertension Maternal Grandmother    Colon cancer Maternal Grandmother    Heart attack Maternal Grandfather    Hypertension Maternal Grandfather    Hypertension Paternal Grandmother    Heart attack Paternal Grandfather    Healthy Son    Healthy Son     SOCIAL HISTORY: Social History   Socioeconomic History   Marital status: Married    Spouse name: Not on file   Number of  children: Not on file   Years of education: Not on file   Highest education level: Not on file  Occupational History   Not on file  Tobacco Use   Smoking status: Never    Passive exposure: Past   Smokeless tobacco: Never  Vaping Use   Vaping status: Never Used  Substance and Sexual Activity   Alcohol use: No   Drug use: No   Sexual activity: Yes    Partners: Male    Birth control/protection: Post-menopausal    Comment: 1st intercourse 39 yo-1 partner, des  Other Topics Concern   Not on file  Social History Narrative   Not on file   Social Drivers of Health   Financial Resource Strain: Not on file  Food Insecurity: No Food Insecurity (01/28/2022)   [facility]   Hunger Vital Sign    Worried About Running Out of Food in the Last Year: Never true    Ran Out of Food in the Last Year: Never true  Transportation Needs: Not on file  Physical Activity: Not on file  Stress: Not on file  Social Connections: Unknown (03/18/2022)   Received from [facility]   Social Network    Social Network: Not on file    MEDICATIONS:  Current Outpatient Medications  Medication Sig Dispense Refill   acetaminophen (TYLENOL) 650 MG CR tablet Take 650 mg by mouth every 8 (eight) hours as needed for pain.     Ascorbic Acid (VITAMIN C PO) Take 1,000 mg by mouth in the morning and at bedtime.     CALCIUM PO Take 600 mg by mouth daily.     Cholecalciferol 100 MCG (4000 UT) CAPS Take 4,000 Units by mouth daily.     EPINEPHrine 0.3 mg/0.3 mL IJ SOAJ injection      escitalopram (LEXAPRO) 10 MG tablet Take 10 mg by mouth daily.     estradiol (ESTRACE) 0.1 MG/GM vaginal cream PLACE 1 APPLICATORFUL VAGINALLY AT BEDTIME. RUB 1 ML INTO VAGINA 3 TIMES PER WEEK FOR SHORT COURSE. 43 g 0   fexofenadine (ALLEGRA ALLERGY) 180 MG tablet Take 180 mg by mouth daily.     gabapentin (NEURONTIN) 300 MG capsule Take 1 capsule (300 mg total) by mouth at bedtime. 30 capsule 6    levothyroxine (SYNTHROID) 88 MCG tablet Take 1 tablet (88 mcg total) by mouth daily. 90 tablet 3   liothyronine (CYTOMEL) 5 MCG tablet Take 1 tablet (5 mcg total) by mouth daily. 90 tablet 3   MAGNESIUM PO Take by mouth daily.     Menatetrenone (VITAMIN K2) 100 MCG TABS Take 100 mcg by mouth daily.     Omega-3 Fatty Acids (FISH OIL) 1000 MG  CAPS Take by mouth daily.     omeprazole (PRILOSEC) 20 MG capsule Take 20 mg by mouth 2 (two) times daily before a meal.     Riboflavin (VITAMIN B2 PO) Take by mouth.     Theanine 100 MG CAPS Take by mouth.     traZODone (DESYREL) 50 MG tablet Take 100 mg by mouth at bedtime.     Ubrogepant (UBRELVY) 100 MG TABS Take 1 tablet (100 mg total) by mouth as needed. 16 tablet 1   meclizine (ANTIVERT) 12.5 MG tablet Take 1 tablet (12.5 mg total) by mouth 3 (three) times daily as needed for dizziness. (Patient not taking: Reported on 02/05/2024) 30 tablet 0   No current facility-administered medications for this visit.    PHYSICAL EXAM: Vitals:   02/05/24 1027  BP: 108/60  Pulse: (!) 59  Resp: 18  SpO2: 99%  Weight: 120 lb 3.2 oz (54.5 kg)  Height: 5\' 3"  (1.6 m)    Body mass index is 21.29 kg/m.  Wt Readings from Last 3 Encounters:  02/05/24 120 lb 3.2 oz (54.5 kg)  11/13/23 122 lb 9.6 oz (55.6 kg)  06/11/23 121 lb (54.9 kg)    General: Well developed, well nourished female in no apparent distress.  HEENT: AT/[place], no external lesions. Hearing intact to the spoken word Eyes: EOMI. No stare, proptosis or lid lag. Conjunctiva clear and no icterus. Neck: Trachea midline, neck supple  Lungs: Clear to auscultation, no wheeze. Respirations not labored Heart: S1S2, Regular in rate and rhythm.  Abdomen: Soft, non tender, non distended Neurologic: Alert, oriented, normal speech, deep tendon biceps reflexes normal,  no gross focal neurological deficit Extremities: No pedal pitting edema, no tremors of outstretched hands Skin: Warm, color good.   Psychiatric: Does not appear depressed or anxious  PERTINENT HISTORIC LABORATORY AND IMAGING STUDIES:  All pertinent laboratory results were reviewed. Please see HPI also for further details.   TSH  Date Value Ref Range Status  02/02/2024 4.37 0.40 - 4.50 mIU/L Final  11/13/2023 7.03 (H) 0.40 - 4.50 mIU/L Final  02/27/2023 0.95 0.35 - 5.50 uIU/mL Final     ASSESSMENT / PLAN  1. Postablative hypothyroidism     -Patient has postablative hypothyroidism since the age of 1.  She was on NP thyroid/Armour Thyroid since 2017, switch to levothyroxine and liothyronine in January 2025. -She is currently taking levothyroxine 88 mcg daily and liothyronine 5 mcg daily. -Recent thyroid function test normal and acceptable.  Plan: -Continue current dose of levothyroxine 88 mcg daily and liothyronine 5 mcg daily. -We can consider to increase levothyroxine if TSH remains in the higher normal range in the future. -Follow-up in 4 months labs prior to follow-up visit as ordered.  Diagnoses and all orders for this visit:  Postablative hypothyroidism -     T4, free -     T3, free -     TSH     DISPOSITION Follow up in clinic in 4 months suggested.  All questions answered and patient verbalized understanding of the plan.  Jamie Nikki Glanzer, MD [facility] Endocrinology [facility] [address] [place], [address] [place], [place] [address] Phone # [phone]  At least part of this note was generated using voice recognition software. Inadvertent word errors may have occurred, which were not recognized during the proofreading process.

## 2024-02-11 DIAGNOSIS — G43719 Chronic migraine without aura, intractable, without status migrainosus: Secondary | ICD-10-CM | POA: Diagnosis not present

## 2024-02-11 DIAGNOSIS — M542 Cervicalgia: Secondary | ICD-10-CM | POA: Diagnosis not present

## 2024-02-15 ENCOUNTER — Ambulatory Visit: Payer: BC Managed Care – PPO | Admitting: Neurology

## 2024-02-18 ENCOUNTER — Encounter: Admitting: Physical Therapy

## 2024-02-23 ENCOUNTER — Encounter

## 2024-02-23 ENCOUNTER — Other Ambulatory Visit

## 2024-02-25 ENCOUNTER — Ambulatory Visit: Admission: RE | Admit: 2024-02-25 | Source: Ambulatory Visit

## 2024-02-25 ENCOUNTER — Ambulatory Visit
Admission: RE | Admit: 2024-02-25 | Discharge: 2024-02-25 | Disposition: A | Discharge status: Home / Self Care | Source: Ambulatory Visit | Attending: Obstetrics and Gynecology

## 2024-02-25 ENCOUNTER — Encounter: Payer: Self-pay | Admitting: Obstetrics and Gynecology

## 2024-02-25 DIAGNOSIS — Z09 Encounter for follow-up examination after completed treatment for conditions other than malignant neoplasm: Secondary | ICD-10-CM | POA: Diagnosis not present

## 2024-02-25 DIAGNOSIS — N63 Unspecified lump in unspecified breast: Secondary | ICD-10-CM

## 2024-02-29 DIAGNOSIS — M542 Cervicalgia: Secondary | ICD-10-CM | POA: Diagnosis not present

## 2024-02-29 DIAGNOSIS — G43719 Chronic migraine without aura, intractable, without status migrainosus: Secondary | ICD-10-CM | POA: Diagnosis not present

## 2024-03-03 DIAGNOSIS — D1801 Hemangioma of skin and subcutaneous tissue: Secondary | ICD-10-CM | POA: Diagnosis not present

## 2024-03-03 DIAGNOSIS — N8110 Cystocele, unspecified: Secondary | ICD-10-CM | POA: Diagnosis not present

## 2024-03-03 DIAGNOSIS — R3915 Urgency of urination: Secondary | ICD-10-CM | POA: Diagnosis not present

## 2024-03-08 DIAGNOSIS — F411 Generalized anxiety disorder: Secondary | ICD-10-CM | POA: Diagnosis not present

## 2024-03-15 DIAGNOSIS — E785 Hyperlipidemia, unspecified: Secondary | ICD-10-CM | POA: Diagnosis not present

## 2024-03-15 DIAGNOSIS — Z79899 Other long term (current) drug therapy: Secondary | ICD-10-CM | POA: Diagnosis not present

## 2024-03-15 DIAGNOSIS — Z0000 Encounter for general adult medical examination without abnormal findings: Secondary | ICD-10-CM | POA: Diagnosis not present

## 2024-03-15 DIAGNOSIS — F419 Anxiety disorder, unspecified: Secondary | ICD-10-CM | POA: Diagnosis not present

## 2024-03-15 DIAGNOSIS — G4700 Insomnia, unspecified: Secondary | ICD-10-CM | POA: Diagnosis not present

## 2024-03-15 DIAGNOSIS — E559 Vitamin D deficiency, unspecified: Secondary | ICD-10-CM | POA: Diagnosis not present

## 2024-03-15 DIAGNOSIS — E039 Hypothyroidism, unspecified: Secondary | ICD-10-CM | POA: Diagnosis not present

## 2024-03-15 DIAGNOSIS — F338 Other recurrent depressive disorders: Secondary | ICD-10-CM | POA: Diagnosis not present

## 2024-03-18 ENCOUNTER — Encounter: Payer: Self-pay | Admitting: Obstetrics and Gynecology

## 2024-03-18 DIAGNOSIS — N63 Unspecified lump in unspecified breast: Secondary | ICD-10-CM

## 2024-03-21 DIAGNOSIS — G43719 Chronic migraine without aura, intractable, without status migrainosus: Secondary | ICD-10-CM | POA: Diagnosis not present

## 2024-03-21 DIAGNOSIS — M542 Cervicalgia: Secondary | ICD-10-CM | POA: Diagnosis not present

## 2024-03-29 ENCOUNTER — Ambulatory Visit: Admitting: Obstetrics and Gynecology

## 2024-03-29 VITALS — BP 112/72 | HR 70 | Wt 116.4 lb

## 2024-03-29 DIAGNOSIS — N6322 Unspecified lump in the left breast, upper inner quadrant: Secondary | ICD-10-CM

## 2024-03-29 NOTE — Progress Notes (Signed)
 58 y.o. G2P2 female with left breast cancer (ER/PR+ managed with lumpectomy, radiation, and anti-estrogen therapy x 2 years, dx 2010), benign right breast biopsy (2024) here for left breast concern. Married.  Patient's last menstrual period was 09/22/2012.   Left breast concern, noticed multiple lumps.  Symptoms for one year or more. Using vaginal estrogen for GSM. No nipple discharge.  Recent  Last mammogram: 02/25/24 BIRADS 3, density c-implants; dx MMG due June 202  GYN HISTORY: No significant history  OB History  Gravida Para Term Preterm AB Living  2 2    2   SAB IAB Ectopic Multiple Live Births      2    # Outcome Date GA Lbr Len/2nd Weight Sex Type Anes PTL Lv  2 Para           1 Para            Past Medical History:  Diagnosis Date   ASCUS of cervix with negative high risk HPV 06/2018   Breast cancer (HCC) 2010   Cervical spinal stenosis    Hypothyroidism    Migraine    Personal history of radiation therapy    2011 left breast   Urgency of urination    Past Surgical History:  Procedure Laterality Date   AUGMENTATION MAMMAPLASTY Bilateral    BREAST BIOPSY Right 06/10/2023   BREAST BIOPSY Right 06/10/2023   US  RT BREAST BX W LOC DEV 1ST LESION IMG BX SPEC US  GUIDE 06/10/2023 [facility]AMMOGRAPHY   BREAST LUMPECTOMY Left 09/25/2009   BREAST SURGERY  2010   left   BREAST SURGERY  04/2015   CAPSULECTOMY Left 07/20/2014   Procedure: LEFT BREAST CAPSULECTOMY/REPLACE IMPLANT FOR RECONSTRUCTION OF LEFT BREAST;  Surgeon: Elidia Grout, MD;  Location: [facility] OR;  Service: Plastics;  Laterality: Left;   DIAGNOSTIC LAPAROSCOPY  1996   Endometriosis   Current Outpatient Medications on File Prior to Visit  Medication Sig Dispense Refill   acetaminophen (TYLENOL) 650 MG CR tablet Take 650 mg by mouth every 8 (eight) hours as needed for pain.     Ascorbic Acid (VITAMIN C PO) Take 1,000 mg by mouth in the morning and at bedtime.     CALCIUM PO Take 600 mg by mouth daily.      Cholecalciferol 100 MCG (4000 UT) CAPS Take 4,000 Units by mouth daily.     EPINEPHrine 0.3 mg/0.3 mL IJ SOAJ injection      estradiol  (ESTRACE ) 0.1 MG/GM vaginal cream PLACE 1 APPLICATORFUL VAGINALLY AT BEDTIME. RUB 1 ML INTO VAGINA 3 TIMES PER WEEK FOR SHORT COURSE. 43 g 0   fexofenadine (ALLEGRA ALLERGY) 180 MG tablet Take 180 mg by mouth daily.     levothyroxine  (SYNTHROID ) 88 MCG tablet Take 1 tablet (88 mcg total) by mouth daily. 90 tablet 3   liothyronine  (CYTOMEL ) 5 MCG tablet Take 1 tablet (5 mcg total) by mouth daily. 90 tablet 3   MAGNESIUM PO Take by mouth daily.     meclizine  (ANTIVERT ) 12.5 MG tablet Take 1 tablet (12.5 mg total) by mouth 3 (three) times daily as needed for dizziness. 30 tablet 0   Menatetrenone (VITAMIN K2) 100 MCG TABS Take 100 mcg by mouth daily.     Omega-3 Fatty Acids (FISH OIL) 1000 MG CAPS Take by mouth daily.     omeprazole (PRILOSEC) 20 MG capsule Take 20 mg by mouth 2 (two) times daily before a meal.     Riboflavin (VITAMIN B2 PO) Take by mouth.  traZODone (DESYREL) 100 MG tablet 1 tablets at bedtime Orally Once a day for 90 days     Ubrogepant  (UBRELVY ) 100 MG TABS Take 1 tablet (100 mg total) by mouth as needed. 16 tablet 1   zonisamide (ZONEGRAN) 25 MG capsule 5 CAPSULES Orally once a day for 30 days     baclofen (LIORESAL) 10 MG tablet 1 tablet as needed Oral Once a day for 10 days     escitalopram (LEXAPRO) 10 MG tablet Take 10 mg by mouth daily. (Patient not taking: Reported on 03/29/2024)     gabapentin  (NEURONTIN ) 300 MG capsule Take 1 capsule (300 mg total) by mouth at bedtime. (Patient not taking: Reported on 03/29/2024) 30 capsule 6   Multiple Vitamins-Minerals (WOMENS 50+ MULTI VITAMIN) TABS 1 tablet Orally once a day     Theanine 100 MG CAPS Take by mouth. (Patient not taking: Reported on 03/29/2024)     No current facility-administered medications on file prior to visit.   Allergies  Allergen Reactions   Bupropion     Other Reaction(s):  Dizziness   Topiramate Other (See Comments)    Headache   Galcanezumab-Gnlm Other (See Comments)    Rash, joint pain, sadness   Qulipta  [Atogepant ] Other (See Comments)    Worsening migraine       PE Today's Vitals   03/29/24 0922  BP: 112/72  Pulse: 70  SpO2: 96%  Weight: 116 lb 6.4 oz (52.8 kg)   Body mass index is 20.62 kg/m.  Physical Exam Vitals reviewed. Nursing note reviewed: DEclined chaperone. Constitutional:      General: She is not in acute distress.    Appearance: Normal appearance.  HENT:     Head: Normocephalic and atraumatic.     Nose: Nose normal.  Eyes:     Extraocular Movements: Extraocular movements intact.     Conjunctiva/sclera: Conjunctivae normal.  Pulmonary:     Effort: Pulmonary effort is normal.  Chest:  Breasts:    Right: Normal. No mass, nipple discharge, skin change or tenderness.     Left: Normal. No mass, nipple discharge, skin change or tenderness.       Comments: +implants 11:00 multiple small masses, ~5cm from nipple Musculoskeletal:        General: Normal range of motion.     Cervical back: Normal range of motion.  Lymphadenopathy:     Upper Body:     Right upper body: No axillary adenopathy.     Left upper body: No axillary adenopathy.  Neurological:     General: No focal deficit present.     Mental Status: She is alert.  Psychiatric:        Mood and Affect: Mood normal.        Behavior: Behavior normal.       Assessment and Plan:        Mass of upper inner quadrant of left breast -     US  LIMITED ULTRASOUND INCLUDING AXILLA LEFT BREAST ; Future -     MM 3D DIAGNOSTIC MAMMOGRAM BILATERAL BREAST W/IMPLANT; Future    Romaine Closs, MD

## 2024-04-07 DIAGNOSIS — G43719 Chronic migraine without aura, intractable, without status migrainosus: Secondary | ICD-10-CM | POA: Diagnosis not present

## 2024-04-07 DIAGNOSIS — K219 Gastro-esophageal reflux disease without esophagitis: Secondary | ICD-10-CM | POA: Diagnosis not present

## 2024-04-07 DIAGNOSIS — M542 Cervicalgia: Secondary | ICD-10-CM | POA: Diagnosis not present

## 2024-04-07 DIAGNOSIS — R140 Abdominal distension (gaseous): Secondary | ICD-10-CM | POA: Diagnosis not present

## 2024-04-08 DIAGNOSIS — R350 Frequency of micturition: Secondary | ICD-10-CM | POA: Diagnosis not present

## 2024-04-08 DIAGNOSIS — M546 Pain in thoracic spine: Secondary | ICD-10-CM | POA: Diagnosis not present

## 2024-04-08 DIAGNOSIS — R3915 Urgency of urination: Secondary | ICD-10-CM | POA: Diagnosis not present

## 2024-04-08 DIAGNOSIS — R102 Pelvic and perineal pain: Secondary | ICD-10-CM | POA: Diagnosis not present

## 2024-04-15 ENCOUNTER — Telehealth: Payer: Self-pay | Admitting: Obstetrics and Gynecology

## 2024-04-15 DIAGNOSIS — E2839 Other primary ovarian failure: Secondary | ICD-10-CM

## 2024-04-15 NOTE — Telephone Encounter (Signed)
 Bone scan reminder

## 2024-04-26 DIAGNOSIS — M542 Cervicalgia: Secondary | ICD-10-CM | POA: Diagnosis not present

## 2024-04-26 DIAGNOSIS — G43719 Chronic migraine without aura, intractable, without status migrainosus: Secondary | ICD-10-CM | POA: Diagnosis not present

## 2024-04-28 ENCOUNTER — Encounter

## 2024-04-28 ENCOUNTER — Other Ambulatory Visit

## 2024-05-03 ENCOUNTER — Ambulatory Visit
Admission: RE | Admit: 2024-05-03 | Discharge: 2024-05-03 | Disposition: A | Discharge status: Home / Self Care | Source: Ambulatory Visit | Attending: Obstetrics and Gynecology

## 2024-05-03 DIAGNOSIS — N6322 Unspecified lump in the left breast, upper inner quadrant: Secondary | ICD-10-CM

## 2024-05-03 DIAGNOSIS — R928 Other abnormal and inconclusive findings on diagnostic imaging of breast: Secondary | ICD-10-CM | POA: Diagnosis not present

## 2024-05-04 ENCOUNTER — Ambulatory Visit: Payer: Self-pay | Admitting: Obstetrics and Gynecology

## 2024-05-17 DIAGNOSIS — G43719 Chronic migraine without aura, intractable, without status migrainosus: Secondary | ICD-10-CM | POA: Diagnosis not present

## 2024-05-17 DIAGNOSIS — M542 Cervicalgia: Secondary | ICD-10-CM | POA: Diagnosis not present

## 2024-05-30 ENCOUNTER — Other Ambulatory Visit: Payer: Self-pay | Admitting: Student

## 2024-05-30 DIAGNOSIS — R109 Unspecified abdominal pain: Secondary | ICD-10-CM

## 2024-05-31 ENCOUNTER — Ambulatory Visit: Payer: BC Managed Care – PPO | Admitting: Endocrinology

## 2024-06-02 ENCOUNTER — Other Ambulatory Visit

## 2024-06-03 DIAGNOSIS — N3000 Acute cystitis without hematuria: Secondary | ICD-10-CM | POA: Diagnosis not present

## 2024-06-04 DIAGNOSIS — N390 Urinary tract infection, site not specified: Secondary | ICD-10-CM | POA: Diagnosis not present

## 2024-06-04 DIAGNOSIS — R300 Dysuria: Secondary | ICD-10-CM | POA: Diagnosis not present

## 2024-06-04 DIAGNOSIS — R81 Glycosuria: Secondary | ICD-10-CM | POA: Diagnosis not present

## 2024-06-06 ENCOUNTER — Ambulatory Visit: Admitting: Endocrinology

## 2024-06-08 DIAGNOSIS — R300 Dysuria: Secondary | ICD-10-CM | POA: Diagnosis not present

## 2024-06-08 DIAGNOSIS — N390 Urinary tract infection, site not specified: Secondary | ICD-10-CM | POA: Diagnosis not present

## 2024-06-23 DIAGNOSIS — J3489 Other specified disorders of nose and nasal sinuses: Secondary | ICD-10-CM | POA: Diagnosis not present

## 2024-06-23 DIAGNOSIS — M950 Acquired deformity of nose: Secondary | ICD-10-CM | POA: Diagnosis not present

## 2024-06-23 DIAGNOSIS — J342 Deviated nasal septum: Secondary | ICD-10-CM | POA: Diagnosis not present

## 2024-06-24 ENCOUNTER — Ambulatory Visit
Admission: RE | Admit: 2024-06-24 | Discharge: 2024-06-24 | Disposition: A | Discharge status: Home / Self Care | Source: Ambulatory Visit | Attending: Student | Admitting: Student

## 2024-06-24 DIAGNOSIS — R1032 Left lower quadrant pain: Secondary | ICD-10-CM | POA: Diagnosis not present

## 2024-06-24 DIAGNOSIS — R109 Unspecified abdominal pain: Secondary | ICD-10-CM

## 2024-06-24 DIAGNOSIS — R197 Diarrhea, unspecified: Secondary | ICD-10-CM | POA: Diagnosis not present

## 2024-06-24 MED ORDER — IOPAMIDOL (ISOVUE-300) INJECTION 61%
100.0000 mL | Freq: Once | INTRAVENOUS | Status: AC | PRN
  Administered 2024-06-24: 100 mL via INTRAVENOUS

## 2024-06-24 NOTE — Progress Notes (Signed)
 Pt brought to nursing area at approximately 1220 after sneezing x2 while in CT, no other symptoms or complaints noted at this time, Sherrilyn, from CT informed Dr. Derrill.  Dr. Derrill saw pt at approximately 1225, continue to monitor pt for awhile longer.  Pt dc'd home around 1240, no complaints at this time, PIV to RAC discontinued

## 2024-06-27 DIAGNOSIS — T66XXXS Radiation sickness, unspecified, sequela: Secondary | ICD-10-CM | POA: Diagnosis not present

## 2024-06-27 DIAGNOSIS — Z9889 Other specified postprocedural states: Secondary | ICD-10-CM | POA: Diagnosis not present

## 2024-06-27 DIAGNOSIS — Z853 Personal history of malignant neoplasm of breast: Secondary | ICD-10-CM | POA: Diagnosis not present

## 2024-06-28 ENCOUNTER — Other Ambulatory Visit

## 2024-06-28 DIAGNOSIS — M542 Cervicalgia: Secondary | ICD-10-CM | POA: Diagnosis not present

## 2024-06-28 DIAGNOSIS — E890 Postprocedural hypothyroidism: Secondary | ICD-10-CM | POA: Diagnosis not present

## 2024-06-28 DIAGNOSIS — G43719 Chronic migraine without aura, intractable, without status migrainosus: Secondary | ICD-10-CM | POA: Diagnosis not present

## 2024-06-28 LAB — T4, FREE: Free T4: 1.1 ng/dL (ref 0.8–1.8)

## 2024-06-28 LAB — TSH: TSH: 4.84 m[IU]/L — ABNORMAL HIGH (ref 0.40–4.50)

## 2024-06-28 LAB — T3, FREE: T3, Free: 2.7 pg/mL (ref 2.3–4.2)

## 2024-06-29 ENCOUNTER — Ambulatory Visit: Payer: Self-pay | Admitting: Endocrinology

## 2024-06-30 DIAGNOSIS — M62838 Other muscle spasm: Secondary | ICD-10-CM | POA: Diagnosis not present

## 2024-06-30 DIAGNOSIS — M6281 Muscle weakness (generalized): Secondary | ICD-10-CM | POA: Diagnosis not present

## 2024-06-30 DIAGNOSIS — M6289 Other specified disorders of muscle: Secondary | ICD-10-CM | POA: Diagnosis not present

## 2024-06-30 DIAGNOSIS — N3946 Mixed incontinence: Secondary | ICD-10-CM | POA: Diagnosis not present

## 2024-07-05 ENCOUNTER — Ambulatory Visit (INDEPENDENT_AMBULATORY_CARE_PROVIDER_SITE_OTHER): Admitting: Endocrinology

## 2024-07-05 ENCOUNTER — Encounter: Payer: Self-pay | Admitting: Endocrinology

## 2024-07-05 VITALS — BP 124/72 | HR 75 | Resp 20 | Ht 63.0 in | Wt 118.0 lb

## 2024-07-05 DIAGNOSIS — E890 Postprocedural hypothyroidism: Secondary | ICD-10-CM | POA: Diagnosis not present

## 2024-07-05 DIAGNOSIS — Z131 Encounter for screening for diabetes mellitus: Secondary | ICD-10-CM

## 2024-07-05 MED ORDER — LEVOTHYROXINE SODIUM 100 MCG PO TABS
100.0000 ug | ORAL_TABLET | Freq: Every day | ORAL | 4 refills | Status: AC
Start: 2024-07-05 — End: ?

## 2024-07-05 NOTE — Progress Notes (Signed)
 Outpatient Endocrinology Note Jamie Izyk Marty, MD   Patient's Name: [name]    DOB: 04-26-1966    MRN: [id number]  REASON OF VISIT: Follow-up for postablative hypothyroidism  PCP: Teresa Channel, MD  HISTORY OF PRESENT ILLNESS:   [name] is a 58 y.o. old female with past medical history as listed below is presented for a follow up  for postablative hypothyroidism.   Pertinent Thyroid  History: Patient was diagnosed with hypothyroidism at the age of 62, after she had radioactive iodine therapy for hyperthyroidism.  She was initially treated with levothyroxine  and changed to Armour Thyroid  in 2017, from outside provider, due to continued fatigue on levothyroxine , symptoms better on Armour Thyroid .  -In January 2025 she was taking NP thyroid , previously on Armour Thyroid ,  90 mg, 1 tablet on 6 days and half tablet on Sundays.  Switched to combination thyroid  hormone replacement with levothyroxine  88 mcg and liothyronine  5 mcg daily in the end of January 2025.   Interval history Patient has been taking levothyroxine  88 mcg daily and liothyronine  5 mcg daily.  She takes in the morning and empty stomach.  Recently patient had stomach issues and took Carafate, Prilosec in the morning.  She has been now taking Pepcid in the morning 1 hour after taking thyroid  medication and Nexium at bedtime.  She also recently started taking calcium carbonate.  This time she is having migraine, has been feeling tired today.  She also feels cold intolerance.  Recent lab results with mildly elevated TSH and normal free T4 and free T3.    Latest Reference Range & Units 06/28/24 13:51  TSH 0.40 - 4.50 mIU/L 4.84 (H)  Triiodothyronine,Free,Serum 2.3 - 4.2 pg/mL 2.7  T4,Free(Direct) 0.8 - 1.8 ng/dL 1.1  (H): Data is abnormally high  REVIEW OF SYSTEMS:  As per history of present illness.   PAST MEDICAL HISTORY: Past Medical History:  Diagnosis Date   ASCUS of cervix with negative high risk HPV 06/2018    Breast cancer (HCC) 2010   Cervical spinal stenosis    Hypothyroidism    Migraine    Personal history of radiation therapy    2011 left breast   Urgency of urination     PAST SURGICAL HISTORY: Past Surgical History:  Procedure Laterality Date   AUGMENTATION MAMMAPLASTY Bilateral    BREAST BIOPSY Right 06/10/2023   BREAST BIOPSY Right 06/10/2023   US  RT BREAST BX W LOC DEV 1ST LESION IMG BX SPEC US  GUIDE 06/10/2023 [facility] MAMMOGRAPHY   BREAST LUMPECTOMY Left 09/25/2009   BREAST SURGERY  2010   left   BREAST SURGERY  04/2015   CAPSULECTOMY Left 07/20/2014   Procedure: LEFT BREAST CAPSULECTOMY/REPLACE IMPLANT FOR RECONSTRUCTION OF LEFT BREAST;  Surgeon: Alm Sick, MD;  Location: [facility] OR;  Service: Plastics;  Laterality: Left;   DIAGNOSTIC LAPAROSCOPY  1996   Endometriosis    ALLERGIES: Allergies  Allergen Reactions   Bupropion     Other Reaction(s): Dizziness   Topiramate Other (See Comments)    Headache   Galcanezumab-Gnlm Other (See Comments)    Rash, joint pain, sadness   Qulipta  [Atogepant ] Other (See Comments)    Worsening migraine     FAMILY HISTORY:  Family History  Problem Relation Age of Onset   Diabetes Mother    Hyperlipidemia Mother    Colon polyps Mother    Sleep apnea Mother    Hypertension Mother    Hyperlipidemia Father    Hypertension Father    Heart  disease Father    Heart failure Father    Other Father        fructose intolerant   Sleep apnea Father    Cardiomyopathy Father    Hypertension Brother    Hypertension Brother    CVA Maternal Aunt    Colon cancer Maternal Uncle    Diabetes Maternal Grandmother    Cancer - Other Maternal Grandmother    CVA Maternal Grandmother    Meniere's disease Maternal Grandmother    Hypertension Maternal Grandmother    Colon cancer Maternal Grandmother    Heart attack Maternal Grandfather    Hypertension Maternal Grandfather    Hypertension Paternal Grandmother    Heart attack Paternal Grandfather     Healthy Son    Healthy Son     SOCIAL HISTORY: Social History   Socioeconomic History   Marital status: Married    Spouse name: Not on file   Number of children: Not on file   Years of education: Not on file   Highest education level: Not on file  Occupational History   Not on file  Tobacco Use   Smoking status: Never    Passive exposure: Past   Smokeless tobacco: Never  Vaping Use   Vaping status: Never Used  Substance and Sexual Activity   Alcohol use: No   Drug use: No   Sexual activity: Yes    Partners: Male    Birth control/protection: Post-menopausal    Comment: 1st intercourse 21 yo-1 partner, des  Other Topics Concern   Not on file  Social History Narrative   Not on file   Social Drivers of Health   Financial Resource Strain: Not on file  Food Insecurity: Low Risk  (06/23/2024)   Received from [facility]   Hunger Vital Sign    Within the past 12 months, you worried that your food would run out before you got money to buy more: Never true    Within the past 12 months, the food you bought just didn't last and you didn't have money to get more. : Never true  Transportation Needs: No Transportation Needs (06/23/2024)   Received from [facility]    In the past 12 months, has lack of reliable transportation kept you from medical appointments, meetings, work or from getting things needed for daily living? : No  Physical Activity: Not on file  Stress: Not on file  Social Connections: Unknown (03/18/2022)   [facility]   Social Network    Social Network: Not on file    MEDICATIONS:  Current Outpatient Medications  Medication Sig Dispense Refill   acetaminophen (TYLENOL) 650 MG CR tablet Take 650 mg by mouth every 8 (eight) hours as needed for pain.     Ascorbic Acid (VITAMIN C PO) Take 1,000 mg by mouth in the morning and at bedtime.     baclofen (LIORESAL) 10 MG tablet 1 tablet as needed Oral Once a day for 10 days      CALCIUM PO Take 600 mg by mouth daily.     Cholecalciferol 100 MCG (4000 UT) CAPS Take 4,000 Units by mouth daily.     EPINEPHrine 0.3 mg/0.3 mL IJ SOAJ injection      escitalopram (LEXAPRO) 10 MG tablet Take 10 mg by mouth daily.     estradiol  (ESTRACE ) 0.1 MG/GM vaginal cream PLACE 1 APPLICATORFUL VAGINALLY AT BEDTIME. RUB 1 ML INTO VAGINA 3 TIMES PER WEEK FOR SHORT COURSE. 43 g 0  fexofenadine (ALLEGRA ALLERGY) 180 MG tablet Take 180 mg by mouth daily.     gabapentin  (NEURONTIN ) 300 MG capsule Take 1 capsule (300 mg total) by mouth at bedtime. 30 capsule 6   liothyronine  (CYTOMEL ) 5 MCG tablet Take 1 tablet (5 mcg total) by mouth daily. 90 tablet 3   MAGNESIUM PO Take by mouth daily.     meclizine  (ANTIVERT ) 12.5 MG tablet Take 1 tablet (12.5 mg total) by mouth 3 (three) times daily as needed for dizziness. 30 tablet 0   Menatetrenone (VITAMIN K2) 100 MCG TABS Take 100 mcg by mouth daily.     Multiple Vitamins-Minerals (WOMENS 50+ MULTI VITAMIN) TABS 1 tablet Orally once a day     Omega-3 Fatty Acids (FISH OIL) 1000 MG CAPS Take by mouth daily.     omeprazole (PRILOSEC) 20 MG capsule Take 20 mg by mouth 2 (two) times daily before a meal.     Riboflavin (VITAMIN B2 PO) Take by mouth.     Theanine 100 MG CAPS Take by mouth.     traZODone (DESYREL) 100 MG tablet 1 tablets at bedtime Orally Once a day for 90 days     Ubrogepant  (UBRELVY ) 100 MG TABS Take 1 tablet (100 mg total) by mouth as needed. 16 tablet 1   zonisamide (ZONEGRAN) 50 MG capsule Take 150 mg by mouth daily.     levothyroxine  (SYNTHROID ) 100 MCG tablet Take 1 tablet (100 mcg total) by mouth daily. 90 tablet 4   No current facility-administered medications for this visit.    PHYSICAL EXAM: Vitals:   07/05/24 1010  BP: 124/72  Pulse: 75  Resp: 20  SpO2: 99%  Weight: 118 lb (53.5 kg)  Height: 5' 3 (1.6 m)    Body mass index is 20.9 kg/m.  Wt Readings from Last 3 Encounters:  07/05/24 118 lb (53.5 kg)  03/29/24 116  lb 6.4 oz (52.8 kg)  02/05/24 120 lb 3.2 oz (54.5 kg)    General: Well developed, well nourished female in no apparent distress.  HEENT: AT/[employer], no external lesions. Hearing intact to the spoken word Eyes: EOMI. No stare, proptosis or lid lag. Conjunctiva clear and no icterus. Neck: Trachea midline, neck supple  Abdomen: Soft, non tender, non distended Neurologic: Alert, oriented, normal speech, deep tendon biceps reflexes normal,  no gross focal neurological deficit Extremities: No pedal pitting edema, no tremors of outstretched hands Skin: Warm, color good.  Psychiatric: Does not appear depressed or anxious  PERTINENT HISTORIC LABORATORY AND IMAGING STUDIES:  All pertinent laboratory results were reviewed. Please see HPI also for further details.   TSH  Date Value Ref Range Status  06/28/2024 4.84 (H) 0.40 - 4.50 mIU/L Final  02/02/2024 4.37 0.40 - 4.50 mIU/L Final  11/13/2023 7.03 (H) 0.40 - 4.50 mIU/L Final     ASSESSMENT / PLAN  1. Postablative hypothyroidism   2. Screening for diabetes mellitus    -Patient has postablative hypothyroidism since the age of 59.  She was on NP thyroid /Armour Thyroid  since 2017, switch to levothyroxine  and liothyronine  in January 2025. -She is currently taking levothyroxine  88 mcg daily and liothyronine  5 mcg daily. -Recent thyroid  function test mild elevated TSH, she has some mild hypothyroid symptoms.  Recently she was taking acid lowering medication in the morning may have affected levothyroxine  absorption.  Plan: -Increase levothyroxine  from 80 to 100 mcg daily.  Continue liothyronine  5 mcg daily. -Follow-up in 4 months labs prior to follow-up visit as ordered. - Check hemoglobin  A1c to screen for diabetes.  She has family history of diabetes mellitus.  Diagnoses and all orders for this visit:  Postablative hypothyroidism -     levothyroxine  (SYNTHROID ) 100 MCG tablet; Take 1 tablet (100 mcg total) by mouth daily. -     T4, free -      TSH  Screening for diabetes mellitus -     Hemoglobin A1c   DISPOSITION Follow up in clinic in 4 months suggested.  Labs prior to follow-up visit as ordered.  All questions answered and patient verbalized understanding of the plan.  Jamie Tanishka Drolet, MD [facility] Endocrinology [facility] [address] [place], [address] [place], [place] [address] Phone # [phone]  At least part of this note was generated using voice recognition software. Inadvertent word errors may have occurred, which were not recognized during the proofreading process.

## 2024-07-05 NOTE — Patient Instructions (Signed)
 Increase levothyroxine  to 100 mcg daily and cytomel  same.

## 2024-07-11 DIAGNOSIS — H903 Sensorineural hearing loss, bilateral: Secondary | ICD-10-CM | POA: Diagnosis not present

## 2024-08-15 DIAGNOSIS — N9411 Superficial (introital) dyspareunia: Secondary | ICD-10-CM | POA: Diagnosis not present

## 2024-08-15 DIAGNOSIS — M6281 Muscle weakness (generalized): Secondary | ICD-10-CM | POA: Diagnosis not present

## 2024-08-15 DIAGNOSIS — R350 Frequency of micturition: Secondary | ICD-10-CM | POA: Diagnosis not present

## 2024-08-15 DIAGNOSIS — M62838 Other muscle spasm: Secondary | ICD-10-CM | POA: Diagnosis not present

## 2024-08-16 DIAGNOSIS — T161XXA Foreign body in right ear, initial encounter: Secondary | ICD-10-CM | POA: Diagnosis not present

## 2024-08-23 DIAGNOSIS — M542 Cervicalgia: Secondary | ICD-10-CM | POA: Diagnosis not present

## 2024-08-23 DIAGNOSIS — G43719 Chronic migraine without aura, intractable, without status migrainosus: Secondary | ICD-10-CM | POA: Diagnosis not present

## 2024-08-31 DIAGNOSIS — N941 Unspecified dyspareunia: Secondary | ICD-10-CM | POA: Diagnosis not present

## 2024-08-31 DIAGNOSIS — R350 Frequency of micturition: Secondary | ICD-10-CM | POA: Diagnosis not present

## 2024-09-01 DIAGNOSIS — K6389 Other specified diseases of intestine: Secondary | ICD-10-CM | POA: Diagnosis not present

## 2024-09-01 DIAGNOSIS — R140 Abdominal distension (gaseous): Secondary | ICD-10-CM | POA: Diagnosis not present

## 2024-09-05 ENCOUNTER — Other Ambulatory Visit

## 2024-09-05 DIAGNOSIS — E890 Postprocedural hypothyroidism: Secondary | ICD-10-CM | POA: Diagnosis not present

## 2024-09-05 DIAGNOSIS — Z131 Encounter for screening for diabetes mellitus: Secondary | ICD-10-CM | POA: Diagnosis not present

## 2024-09-06 ENCOUNTER — Ambulatory Visit: Payer: Self-pay | Admitting: Endocrinology

## 2024-09-06 DIAGNOSIS — H2513 Age-related nuclear cataract, bilateral: Secondary | ICD-10-CM | POA: Diagnosis not present

## 2024-09-06 DIAGNOSIS — E0500 Thyrotoxicosis with diffuse goiter without thyrotoxic crisis or storm: Secondary | ICD-10-CM | POA: Diagnosis not present

## 2024-09-06 DIAGNOSIS — D3131 Benign neoplasm of right choroid: Secondary | ICD-10-CM | POA: Diagnosis not present

## 2024-09-06 LAB — HEMOGLOBIN A1C
Hgb A1c MFr Bld: 5 % (ref <5.7)
Mean Plasma Glucose: 97 mg/dL
eAG (mmol/L): 5.4 mmol/L

## 2024-09-06 LAB — TSH: TSH: 0.75 m[IU]/L (ref 0.40–4.50)

## 2024-09-06 LAB — T4, FREE: Free T4: 1.3 ng/dL (ref 0.8–1.8)

## 2024-09-08 DIAGNOSIS — K219 Gastro-esophageal reflux disease without esophagitis: Secondary | ICD-10-CM | POA: Diagnosis not present

## 2024-09-08 DIAGNOSIS — R140 Abdominal distension (gaseous): Secondary | ICD-10-CM | POA: Diagnosis not present

## 2024-09-08 DIAGNOSIS — R634 Abnormal weight loss: Secondary | ICD-10-CM | POA: Diagnosis not present

## 2024-09-08 DIAGNOSIS — R110 Nausea: Secondary | ICD-10-CM | POA: Diagnosis not present

## 2024-09-08 DIAGNOSIS — R142 Eructation: Secondary | ICD-10-CM | POA: Diagnosis not present

## 2024-09-09 ENCOUNTER — Ambulatory Visit (INDEPENDENT_AMBULATORY_CARE_PROVIDER_SITE_OTHER): Admitting: Endocrinology

## 2024-09-09 ENCOUNTER — Encounter: Payer: Self-pay | Admitting: Endocrinology

## 2024-09-09 VITALS — BP 100/60 | HR 68 | Resp 16 | Ht 63.0 in | Wt 114.8 lb

## 2024-09-09 DIAGNOSIS — E890 Postprocedural hypothyroidism: Secondary | ICD-10-CM

## 2024-09-09 MED ORDER — LIOTHYRONINE SODIUM 5 MCG PO TABS: 5.0000 ug | ORAL_TABLET | Freq: Every day | ORAL | 3 refills | Status: AC

## 2024-09-09 NOTE — Progress Notes (Signed)
 Outpatient Endocrinology Note Jamie Ryback, MD   Patient's Name: [name]    DOB: September 26, 1966    MRN: [id number]  REASON OF VISIT: Follow-up for postablative hypothyroidism  PCP: Teresa Channel, MD  HISTORY OF PRESENT ILLNESS:   [name] is a 58 y.o. old female with past medical history as listed below is presented for a follow up  for postablative hypothyroidism.   Pertinent Thyroid  History: Patient was diagnosed with hypothyroidism at the age of 66, after she had radioactive iodine therapy for hyperthyroidism.  She was initially treated with levothyroxine  and changed to Armour Thyroid  in 2017, from outside provider, due to continued fatigue on levothyroxine , symptoms better on Armour Thyroid .  -In January 2025 she was taking NP thyroid , previously on Armour Thyroid ,  90 mg, 1 tablet on 6 days and half tablet on Sundays.  Switched to combination thyroid  hormone replacement with levothyroxine  88 mcg and liothyronine  5 mcg daily in the end of January 2025.  Levothyroxine  was increased to 100 mg daily with mild elevated TSH in August 2025.   Interval history Patient has been taking levothyroxine  100 mcg daily and liothyronine  5 mcg daily.  She has been taking in the morning in empty stomach and reports compliance.  Denies palpitation and heat intolerance.  Overall feeling good.  Recent thyroid  function test normal as follows.  She also has normal hemoglobin A1c.    Latest Reference Range & Units 09/05/24 10:37  TSH 0.40 - 4.50 mIU/L 0.75  T4,Free(Direct) 0.8 - 1.8 ng/dL 1.3    Latest Reference Range & Units 09/05/24 10:37  Hemoglobin A1C <5.7 % 5.0    REVIEW OF SYSTEMS:  As per history of present illness.   PAST MEDICAL HISTORY: Past Medical History:  Diagnosis Date   ASCUS of cervix with negative high risk HPV 06/2018   Breast cancer (HCC) 2010   Cervical spinal stenosis    Hypothyroidism    Migraine    Personal history of radiation therapy    2011 left breast    Urgency of urination     PAST SURGICAL HISTORY: Past Surgical History:  Procedure Laterality Date   AUGMENTATION MAMMAPLASTY Bilateral    BREAST BIOPSY Right 06/10/2023   BREAST BIOPSY Right 06/10/2023   US  RT BREAST BX W LOC DEV 1ST LESION IMG BX SPEC US  GUIDE 06/10/2023 [facility] MAMMOGRAPHY   BREAST LUMPECTOMY Left 09/25/2009   BREAST SURGERY  2010   left   BREAST SURGERY  04/2015   CAPSULECTOMY Left 07/20/2014   Procedure: LEFT BREAST CAPSULECTOMY/REPLACE IMPLANT FOR RECONSTRUCTION OF LEFT BREAST;  Surgeon: Alm Sick, MD;  Location: [facility] OR;  Service: Plastics;  Laterality: Left;   DIAGNOSTIC LAPAROSCOPY  1996   Endometriosis    ALLERGIES: Allergies  Allergen Reactions   Bupropion     Other Reaction(s): Dizziness   Topiramate Other (See Comments)    Headache   Galcanezumab-Gnlm Other (See Comments)    Rash, joint pain, sadness   Qulipta  [Atogepant ] Other (See Comments)    Worsening migraine     FAMILY HISTORY:  Family History  Problem Relation Age of Onset   Diabetes Mother    Hyperlipidemia Mother    Colon polyps Mother    Sleep apnea Mother    Hypertension Mother    Hyperlipidemia Father    Hypertension Father    Heart disease Father    Heart failure Father    Other Father        fructose intolerant   Sleep  apnea Father    Cardiomyopathy Father    Hypertension Brother    Hypertension Brother    CVA Maternal Aunt    Colon cancer Maternal Uncle    Diabetes Maternal Grandmother    Cancer - Other Maternal Grandmother    CVA Maternal Grandmother    Meniere's disease Maternal Grandmother    Hypertension Maternal Grandmother    Colon cancer Maternal Grandmother    Heart attack Maternal Grandfather    Hypertension Maternal Grandfather    Hypertension Paternal Grandmother    Heart attack Paternal Grandfather    Healthy Son    Healthy Son     SOCIAL HISTORY: Social History   Socioeconomic History   Marital status: Married    Spouse name: Not on file    Number of children: Not on file   Years of education: Not on file   Highest education level: Not on file  Occupational History   Not on file  Tobacco Use   Smoking status: Never    Passive exposure: Past   Smokeless tobacco: Never  Vaping Use   Vaping status: Never Used  Substance and Sexual Activity   Alcohol use: No   Drug use: No   Sexual activity: Yes    Partners: Male    Birth control/protection: Post-menopausal    Comment: 1st intercourse 28 yo-1 partner, des  Other Topics Concern   Not on file  Social History Narrative   Not on file   Social Drivers of Health   Financial Resource Strain: Not on file  Food Insecurity: Low Risk  (06/23/2024)   Received from [facility]   Hunger Vital Sign    Within the past 12 months, you worried that your food would run out before you got money to buy more: Never true    Within the past 12 months, the food you bought just didn't last and you didn't have money to get more. : Never true  Transportation Needs: No Transportation Needs (06/23/2024)   Received from [facility]    In the past 12 months, has lack of reliable transportation kept you from medical appointments, meetings, work or from getting things needed for daily living? : No  Physical Activity: Not on file  Stress: Not on file  Social Connections: Unknown (03/18/2022)   Received from [facility]   Social Network    Social Network: Not on file    MEDICATIONS:  Current Outpatient Medications  Medication Sig Dispense Refill   acetaminophen (TYLENOL) 650 MG CR tablet Take 650 mg by mouth every 8 (eight) hours as needed for pain.     baclofen (LIORESAL) 10 MG tablet 1 tablet as needed Oral Once a day for 10 days     esomeprazole (NEXIUM) 20 MG capsule 20 mg.     estradiol  (ESTRACE ) 0.1 MG/GM vaginal cream PLACE 1 APPLICATORFUL VAGINALLY AT BEDTIME. RUB 1 ML INTO VAGINA 3 TIMES PER WEEK FOR SHORT COURSE. 43 g 0   fexofenadine (ALLEGRA ALLERGY) 180 MG  tablet Take 180 mg by mouth daily.     levothyroxine  (SYNTHROID ) 100 MCG tablet Take 1 tablet (100 mcg total) by mouth daily. 90 tablet 4   MAGNESIUM PO Take by mouth daily.     traZODone (DESYREL) 100 MG tablet 1 tablets at bedtime Orally Once a day for 90 days     Ubrogepant  (UBRELVY ) 100 MG TABS Take 1 tablet (100 mg total) by mouth as needed. 16 tablet 1   zonisamide (ZONEGRAN)  50 MG capsule Take 150 mg by mouth daily.     liothyronine  (CYTOMEL ) 5 MCG tablet Take 1 tablet (5 mcg total) by mouth daily. 90 tablet 3   No current facility-administered medications for this visit.    PHYSICAL EXAM: Vitals:   09/09/24 0908  BP: 100/60  Pulse: 68  Resp: 16  SpO2: 98%  Weight: 114 lb 12.8 oz (52.1 kg)  Height: 5' 3 (1.6 m)    Body mass index is 20.34 kg/m.  Wt Readings from Last 3 Encounters:  09/09/24 114 lb 12.8 oz (52.1 kg)  07/05/24 118 lb (53.5 kg)  03/29/24 116 lb 6.4 oz (52.8 kg)    General: Well developed, well nourished female in no apparent distress.  HEENT: AT/[place], no external lesions. Hearing intact to the spoken word Eyes: EOMI. No stare, proptosis or lid lag. Conjunctiva clear and no icterus. Neck: Trachea midline, neck supple  Abdomen: Soft, non tender, non distended Neurologic: Alert, oriented, normal speech, deep tendon biceps reflexes normal,  no gross focal neurological deficit Extremities: No pedal pitting edema, no tremors of outstretched hands Skin: Warm, color good.  Psychiatric: Does not appear depressed or anxious  PERTINENT HISTORIC LABORATORY AND IMAGING STUDIES:  All pertinent laboratory results were reviewed. Please see HPI also for further details.   TSH  Date Value Ref Range Status  09/05/2024 0.75 0.40 - 4.50 mIU/L Final  06/28/2024 4.84 (H) 0.40 - 4.50 mIU/L Final  02/02/2024 4.37 0.40 - 4.50 mIU/L Final     ASSESSMENT / PLAN  1. Postablative hypothyroidism    -Patient has postablative hypothyroidism since the age of 6.  She was on NP  thyroid /Armour Thyroid  since 2017, switch to levothyroxine  and liothyronine  in January 2025. -She is currently taking levothyroxine  100 mcg daily and liothyronine  5 mcg daily. -Recent thyroid  function test normal.  She is clinically euthyroid today.  Plan: - Continue levothyroxine  100 mcg daily.  Continue liothyronine  5 mcg daily. - Follow-up in 6 months labs prior to follow-up visit as ordered.  Diagnoses and all orders for this visit:  Postablative hypothyroidism -     T4, free -     T3, free -     TSH -     liothyronine  (CYTOMEL ) 5 MCG tablet; Take 1 tablet (5 mcg total) by mouth daily.    DISPOSITION Follow up in clinic in 6 months suggested.  Labs prior to follow-up visit as ordered.  All questions answered and patient verbalized understanding of the plan.  Lovelle Lema, MD [facility] Endocrinology [facility] [address] [place], [address] [place], [place] [address] Phone # [phone]  At least part of this note was generated using voice recognition software. Inadvertent word errors may have occurred, which were not recognized during the proofreading process.

## 2025-03-06 ENCOUNTER — Other Ambulatory Visit

## 2025-03-09 ENCOUNTER — Ambulatory Visit: Admitting: Endocrinology
